# Patient Record
Sex: Female | Born: 1985 | Race: White | Hispanic: No | Marital: Married | Smoking: Never smoker
Health system: Southern US, Community
[De-identification: ages and names within clinical notes are randomized; demographics above are authoritative.]

## PROBLEM LIST (undated history)

## (undated) DIAGNOSIS — J45909 Unspecified asthma, uncomplicated: Secondary | ICD-10-CM

## (undated) DIAGNOSIS — Z789 Other specified health status: Secondary | ICD-10-CM

## (undated) DIAGNOSIS — F329 Major depressive disorder, single episode, unspecified: Secondary | ICD-10-CM

## (undated) DIAGNOSIS — F32A Depression, unspecified: Secondary | ICD-10-CM

## (undated) HISTORY — PX: CHOLECYSTECTOMY: SHX55

---
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
## 2012-04-01 LAB — OB RESULTS CONSOLE ABO/RH: RH Type: POSITIVE

## 2012-04-01 LAB — OB RESULTS CONSOLE ANTIBODY SCREEN: Antibody Screen: NEGATIVE

## 2012-04-01 LAB — OB RESULTS CONSOLE GC/CHLAMYDIA: Gonorrhea: NEGATIVE

## 2012-10-14 ENCOUNTER — Encounter (HOSPITAL_COMMUNITY): Payer: Self-pay | Admitting: *Deleted

## 2012-10-14 ENCOUNTER — Inpatient Hospital Stay (HOSPITAL_COMMUNITY)
Admission: AD | Admit: 2012-10-14 | Discharge: 2012-10-14 | Disposition: A | Discharge status: Home / Self Care | Payer: BC Managed Care – PPO | Source: Ambulatory Visit | Attending: Obstetrics and Gynecology | Admitting: Obstetrics and Gynecology

## 2012-10-14 DIAGNOSIS — O99891 Other specified diseases and conditions complicating pregnancy: Secondary | ICD-10-CM | POA: Insufficient documentation

## 2012-10-14 DIAGNOSIS — Z22330 Carrier of Group B streptococcus: Secondary | ICD-10-CM | POA: Insufficient documentation

## 2012-10-14 DIAGNOSIS — O479 False labor, unspecified: Secondary | ICD-10-CM | POA: Insufficient documentation

## 2012-10-14 HISTORY — DX: Other specified health status: Z78.9

## 2012-10-14 NOTE — MAU Note (Signed)
Pt presents for contractions that increased in intensity at 1400 today, and are now every 4-80minutes.  Denies any LOF or bleeding.  Does have a thick yellow-brown discharge.  Reports good fetal movement.  GBS positive.

## 2012-10-15 ENCOUNTER — Inpatient Hospital Stay (HOSPITAL_COMMUNITY): Payer: BC Managed Care – PPO | Admitting: Anesthesiology

## 2012-10-15 ENCOUNTER — Encounter (HOSPITAL_COMMUNITY): Payer: Self-pay | Admitting: *Deleted

## 2012-10-15 ENCOUNTER — Encounter (HOSPITAL_COMMUNITY): Payer: Self-pay

## 2012-10-15 ENCOUNTER — Encounter (HOSPITAL_COMMUNITY)
Admission: AD | Disposition: A | Discharge status: Home / Self Care | Payer: Self-pay | Source: Ambulatory Visit | Attending: Obstetrics and Gynecology

## 2012-10-15 ENCOUNTER — Inpatient Hospital Stay (HOSPITAL_COMMUNITY)
Admission: AD | Admit: 2012-10-15 | Discharge: 2012-10-18 | DRG: 371 | Disposition: A | Discharge status: Home / Self Care | Payer: BC Managed Care – PPO | Source: Ambulatory Visit | Attending: Obstetrics and Gynecology | Admitting: Obstetrics and Gynecology

## 2012-10-15 ENCOUNTER — Encounter (HOSPITAL_COMMUNITY): Payer: Self-pay | Admitting: Anesthesiology

## 2012-10-15 DIAGNOSIS — O99344 Other mental disorders complicating childbirth: Secondary | ICD-10-CM | POA: Diagnosis present

## 2012-10-15 DIAGNOSIS — F329 Major depressive disorder, single episode, unspecified: Secondary | ICD-10-CM | POA: Diagnosis present

## 2012-10-15 DIAGNOSIS — Z22330 Carrier of Group B streptococcus: Secondary | ICD-10-CM

## 2012-10-15 DIAGNOSIS — O324XX0 Maternal care for high head at term, not applicable or unspecified: Secondary | ICD-10-CM | POA: Diagnosis present

## 2012-10-15 DIAGNOSIS — Z98891 History of uterine scar from previous surgery: Secondary | ICD-10-CM

## 2012-10-15 DIAGNOSIS — F3289 Other specified depressive episodes: Secondary | ICD-10-CM | POA: Diagnosis present

## 2012-10-15 DIAGNOSIS — O99892 Other specified diseases and conditions complicating childbirth: Secondary | ICD-10-CM | POA: Diagnosis present

## 2012-10-15 LAB — CBC
HCT: 39.3 % (ref 36.0–46.0)
Hemoglobin: 13 g/dL (ref 12.0–15.0)
MCH: 30.4 pg (ref 26.0–34.0)
MCHC: 33.1 g/dL (ref 30.0–36.0)
RDW: 14.1 % (ref 11.5–15.5)

## 2012-10-15 LAB — TYPE AND SCREEN
ABO/RH(D): A POS
Antibody Screen: NEGATIVE

## 2012-10-15 LAB — ABO/RH: ABO/RH(D): A POS

## 2012-10-15 SURGERY — Surgical Case
Anesthesia: Epidural | Site: Abdomen | Wound class: Clean Contaminated

## 2012-10-15 MED ORDER — SCOPOLAMINE 1 MG/3DAYS TD PT72
MEDICATED_PATCH | TRANSDERMAL | Status: AC
  Administered 2012-10-15: 1.5 mg via TRANSDERMAL
  Filled 2012-10-15: qty 1

## 2012-10-15 MED ORDER — LACTATED RINGERS IV SOLN
INTRAVENOUS | Status: DC
  Administered 2012-10-15 (×3): via INTRAVENOUS

## 2012-10-15 MED ORDER — PHENYLEPHRINE 40 MCG/ML (10ML) SYRINGE FOR IV PUSH (FOR BLOOD PRESSURE SUPPORT)
PREFILLED_SYRINGE | INTRAVENOUS | Status: AC
  Filled 2012-10-15: qty 15

## 2012-10-15 MED ORDER — EPHEDRINE 5 MG/ML INJ
INTRAVENOUS | Status: AC
  Filled 2012-10-15: qty 10

## 2012-10-15 MED ORDER — FENTANYL 2.5 MCG/ML BUPIVACAINE 1/10 % EPIDURAL INFUSION (WH - ANES)
INTRAMUSCULAR | Status: DC | PRN
  Administered 2012-10-15: 14 mL/h via EPIDURAL

## 2012-10-15 MED ORDER — CEFAZOLIN SODIUM-DEXTROSE 2-3 GM-% IV SOLR: 2.0000 g | Freq: Once | INTRAVENOUS | Status: DC

## 2012-10-15 MED ORDER — CHLOROPROCAINE HCL 3 % IJ SOLN
INTRAMUSCULAR | Status: DC | PRN
  Administered 2012-10-15 (×2): 10 mL via EPIDURAL

## 2012-10-15 MED ORDER — LIDOCAINE HCL (PF) 1 % IJ SOLN
INTRAMUSCULAR | Status: DC | PRN
  Administered 2012-10-15 (×2): 4 mL

## 2012-10-15 MED ORDER — PHENYLEPHRINE HCL 10 MG/ML IJ SOLN
INTRAMUSCULAR | Status: DC | PRN
  Administered 2012-10-15 (×2): 80 ug via INTRAVENOUS
  Administered 2012-10-15: 40 ug via INTRAVENOUS

## 2012-10-15 MED ORDER — TERBUTALINE SULFATE 1 MG/ML IJ SOLN: 0.2500 mg | Freq: Once | INTRAMUSCULAR | Status: DC | PRN

## 2012-10-15 MED ORDER — PHENYLEPHRINE 40 MCG/ML (10ML) SYRINGE FOR IV PUSH (FOR BLOOD PRESSURE SUPPORT): 80.0000 ug | PREFILLED_SYRINGE | INTRAVENOUS | Status: DC | PRN

## 2012-10-15 MED ORDER — ONDANSETRON HCL 4 MG/2ML IJ SOLN: 4.0000 mg | Freq: Four times a day (QID) | INTRAMUSCULAR | Status: DC | PRN

## 2012-10-15 MED ORDER — LACTATED RINGERS IV SOLN
INTRAVENOUS | Status: DC | PRN
  Administered 2012-10-15 (×2): via INTRAVENOUS

## 2012-10-15 MED ORDER — LACTATED RINGERS IV SOLN: 500.0000 mL | Freq: Once | INTRAVENOUS | Status: DC

## 2012-10-15 MED ORDER — OXYTOCIN 10 UNIT/ML IJ SOLN
INTRAMUSCULAR | Status: AC
  Filled 2012-10-15: qty 4

## 2012-10-15 MED ORDER — SODIUM BICARBONATE 8.4 % IV SOLN
INTRAVENOUS | Status: AC
  Filled 2012-10-15: qty 50

## 2012-10-15 MED ORDER — LACTATED RINGERS IV SOLN: 500.0000 mL | INTRAVENOUS | Status: DC | PRN

## 2012-10-15 MED ORDER — LIDOCAINE HCL (PF) 1 % IJ SOLN
30.0000 mL | INTRAMUSCULAR | Status: DC | PRN
  Filled 2012-10-15: qty 30

## 2012-10-15 MED ORDER — ONDANSETRON HCL 4 MG/2ML IJ SOLN
INTRAMUSCULAR | Status: AC
  Filled 2012-10-15: qty 2

## 2012-10-15 MED ORDER — MEPERIDINE HCL 25 MG/ML IJ SOLN
INTRAMUSCULAR | Status: DC | PRN
  Administered 2012-10-15 (×2): 12.5 mg via INTRAVENOUS

## 2012-10-15 MED ORDER — FENTANYL 2.5 MCG/ML BUPIVACAINE 1/10 % EPIDURAL INFUSION (WH - ANES)
14.0000 mL/h | INTRAMUSCULAR | Status: DC
  Administered 2012-10-15: 14 mL/h via EPIDURAL
  Filled 2012-10-15 (×2): qty 125

## 2012-10-15 MED ORDER — EPHEDRINE 5 MG/ML INJ: 10.0000 mg | INTRAVENOUS | Status: DC | PRN

## 2012-10-15 MED ORDER — DIPHENHYDRAMINE HCL 50 MG/ML IJ SOLN: 12.5000 mg | INTRAMUSCULAR | Status: DC | PRN

## 2012-10-15 MED ORDER — OXYCODONE-ACETAMINOPHEN 5-325 MG PO TABS: 1.0000 | ORAL_TABLET | ORAL | Status: DC | PRN

## 2012-10-15 MED ORDER — ONDANSETRON HCL 4 MG/2ML IJ SOLN
INTRAMUSCULAR | Status: DC | PRN
  Administered 2012-10-15: 4 mg via INTRAVENOUS

## 2012-10-15 MED ORDER — LIDOCAINE-EPINEPHRINE (PF) 2 %-1:200000 IJ SOLN
INTRAMUSCULAR | Status: AC
  Filled 2012-10-15: qty 20

## 2012-10-15 MED ORDER — PHENYLEPHRINE 40 MCG/ML (10ML) SYRINGE FOR IV PUSH (FOR BLOOD PRESSURE SUPPORT)
PREFILLED_SYRINGE | INTRAVENOUS | Status: AC
  Filled 2012-10-15: qty 5

## 2012-10-15 MED ORDER — LACTATED RINGERS IV SOLN
INTRAVENOUS | Status: DC | PRN
  Administered 2012-10-15: 22:00:00 via INTRAVENOUS

## 2012-10-15 MED ORDER — EPHEDRINE 5 MG/ML INJ
10.0000 mg | INTRAVENOUS | Status: DC | PRN
  Filled 2012-10-15: qty 4

## 2012-10-15 MED ORDER — PENICILLIN G POTASSIUM 5000000 UNITS IJ SOLR
5.0000 10*6.[IU] | Freq: Once | INTRAMUSCULAR | Status: AC
  Administered 2012-10-15: 5 10*6.[IU] via INTRAVENOUS
  Filled 2012-10-15: qty 5

## 2012-10-15 MED ORDER — OXYTOCIN 10 UNIT/ML IJ SOLN
40.0000 [IU] | INTRAVENOUS | Status: DC | PRN
  Administered 2012-10-15: 40 [IU] via INTRAVENOUS

## 2012-10-15 MED ORDER — ACETAMINOPHEN 325 MG PO TABS: 650.0000 mg | ORAL_TABLET | ORAL | Status: DC | PRN

## 2012-10-15 MED ORDER — CEFAZOLIN SODIUM-DEXTROSE 2-3 GM-% IV SOLR
INTRAVENOUS | Status: DC | PRN
  Administered 2012-10-15: 2 g via INTRAVENOUS

## 2012-10-15 MED ORDER — KETOROLAC TROMETHAMINE 60 MG/2ML IM SOLN
60.0000 mg | Freq: Once | INTRAMUSCULAR | Status: AC | PRN
  Administered 2012-10-15: 60 mg via INTRAMUSCULAR

## 2012-10-15 MED ORDER — MEPERIDINE HCL 25 MG/ML IJ SOLN: 6.2500 mg | INTRAMUSCULAR | Status: DC | PRN

## 2012-10-15 MED ORDER — MORPHINE SULFATE 0.5 MG/ML IJ SOLN
INTRAMUSCULAR | Status: AC
  Filled 2012-10-15: qty 10

## 2012-10-15 MED ORDER — CHLOROPROCAINE HCL 3 % IJ SOLN
INTRAMUSCULAR | Status: AC
  Filled 2012-10-15: qty 20

## 2012-10-15 MED ORDER — OXYTOCIN BOLUS FROM INFUSION: 500.0000 mL | INTRAVENOUS | Status: DC

## 2012-10-15 MED ORDER — PENICILLIN G POTASSIUM 5000000 UNITS IJ SOLR
2.5000 10*6.[IU] | INTRAVENOUS | Status: DC
  Administered 2012-10-15 (×2): 2.5 10*6.[IU] via INTRAVENOUS
  Filled 2012-10-15 (×5): qty 2.5

## 2012-10-15 MED ORDER — OXYTOCIN 40 UNITS IN LACTATED RINGERS INFUSION - SIMPLE MED
1.0000 m[IU]/min | INTRAVENOUS | Status: DC
  Administered 2012-10-15: 1 m[IU]/min via INTRAVENOUS
  Filled 2012-10-15: qty 1000

## 2012-10-15 MED ORDER — FENTANYL CITRATE 0.05 MG/ML IJ SOLN: 25.0000 ug | INTRAMUSCULAR | Status: DC | PRN

## 2012-10-15 MED ORDER — IBUPROFEN 600 MG PO TABS: 600.0000 mg | ORAL_TABLET | Freq: Four times a day (QID) | ORAL | Status: DC | PRN

## 2012-10-15 MED ORDER — CEFAZOLIN SODIUM-DEXTROSE 2-3 GM-% IV SOLR
INTRAVENOUS | Status: AC
  Filled 2012-10-15: qty 50

## 2012-10-15 MED ORDER — SCOPOLAMINE 1 MG/3DAYS TD PT72
1.0000 | MEDICATED_PATCH | Freq: Once | TRANSDERMAL | Status: DC
  Administered 2012-10-15: 1.5 mg via TRANSDERMAL

## 2012-10-15 MED ORDER — CITRIC ACID-SODIUM CITRATE 334-500 MG/5ML PO SOLN
30.0000 mL | ORAL | Status: DC | PRN
  Administered 2012-10-15: 30 mL via ORAL
  Filled 2012-10-15: qty 15

## 2012-10-15 MED ORDER — OXYTOCIN 40 UNITS IN LACTATED RINGERS INFUSION - SIMPLE MED: 62.5000 mL/h | INTRAVENOUS | Status: DC

## 2012-10-15 MED ORDER — SODIUM BICARBONATE 8.4 % IV SOLN
INTRAVENOUS | Status: DC | PRN
  Administered 2012-10-15: 5 mL via EPIDURAL

## 2012-10-15 MED ORDER — MORPHINE SULFATE (PF) 0.5 MG/ML IJ SOLN
INTRAMUSCULAR | Status: DC | PRN
  Administered 2012-10-15: 1 mg via INTRAVENOUS
  Administered 2012-10-15: 4 mg via EPIDURAL

## 2012-10-15 MED ORDER — MEPERIDINE HCL 25 MG/ML IJ SOLN
INTRAMUSCULAR | Status: AC
  Filled 2012-10-15: qty 1

## 2012-10-15 MED ORDER — PHENYLEPHRINE 40 MCG/ML (10ML) SYRINGE FOR IV PUSH (FOR BLOOD PRESSURE SUPPORT)
80.0000 ug | PREFILLED_SYRINGE | INTRAVENOUS | Status: DC | PRN
  Filled 2012-10-15: qty 5

## 2012-10-15 MED ORDER — KETOROLAC TROMETHAMINE 60 MG/2ML IM SOLN
INTRAMUSCULAR | Status: AC
  Administered 2012-10-15: 60 mg via INTRAMUSCULAR
  Filled 2012-10-15: qty 2

## 2012-10-15 MED ORDER — OXYTOCIN 40 UNITS IN LACTATED RINGERS INFUSION - SIMPLE MED: 1.0000 m[IU]/min | INTRAVENOUS | Status: DC

## 2012-10-15 SURGICAL SUPPLY — 35 items
BENZOIN TINCTURE PRP APPL 2/3 (GAUZE/BANDAGES/DRESSINGS) ×2 IMPLANT
CLOTH BEACON ORANGE TIMEOUT ST (SAFETY) ×2 IMPLANT
CONTAINER PREFILL 10% NBF 15ML (MISCELLANEOUS) IMPLANT
DRAPE LG THREE QUARTER DISP (DRAPES) ×2 IMPLANT
DRSG OPSITE POSTOP 4X10 (GAUZE/BANDAGES/DRESSINGS) ×2 IMPLANT
DURAPREP 26ML APPLICATOR (WOUND CARE) ×2 IMPLANT
ELECT REM PT RETURN 9FT ADLT (ELECTROSURGICAL) ×2
ELECTRODE REM PT RTRN 9FT ADLT (ELECTROSURGICAL) ×1 IMPLANT
EXTRACTOR VACUUM KIWI (MISCELLANEOUS) IMPLANT
EXTRACTOR VACUUM M CUP 4 TUBE (SUCTIONS) IMPLANT
GLOVE BIO SURGEON STRL SZ 6.5 (GLOVE) ×2 IMPLANT
GOWN PREVENTION PLUS LG XLONG (DISPOSABLE) ×4 IMPLANT
KIT ABG SYR 3ML LUER SLIP (SYRINGE) IMPLANT
NEEDLE HYPO 25X5/8 SAFETYGLIDE (NEEDLE) IMPLANT
NS IRRIG 1000ML POUR BTL (IV SOLUTION) ×2 IMPLANT
PACK C SECTION WH (CUSTOM PROCEDURE TRAY) ×2 IMPLANT
PAD ABD 7.5X8 STRL (GAUZE/BANDAGES/DRESSINGS) ×2 IMPLANT
PAD OB MATERNITY 4.3X12.25 (PERSONAL CARE ITEMS) ×2 IMPLANT
RTRCTR C-SECT PINK 25CM LRG (MISCELLANEOUS) ×2 IMPLANT
SLEEVE SCD COMPRESS KNEE MED (MISCELLANEOUS) ×2 IMPLANT
STAPLER VISISTAT 35W (STAPLE) IMPLANT
STRIP CLOSURE SKIN 1/2X4 (GAUZE/BANDAGES/DRESSINGS) IMPLANT
SUT CHROMIC 1 CTX 36 (SUTURE) ×4 IMPLANT
SUT PLAIN 0 NONE (SUTURE) IMPLANT
SUT PLAIN 2 0 XLH (SUTURE) IMPLANT
SUT VIC AB 0 CT1 27 (SUTURE) ×2
SUT VIC AB 0 CT1 27XBRD ANBCTR (SUTURE) ×2 IMPLANT
SUT VIC AB 2-0 CT1 27 (SUTURE)
SUT VIC AB 2-0 CT1 TAPERPNT 27 (SUTURE) IMPLANT
SUT VIC AB 3-0 SH 27 (SUTURE) ×1
SUT VIC AB 3-0 SH 27X BRD (SUTURE) ×1 IMPLANT
SUT VIC AB 4-0 KS 27 (SUTURE) IMPLANT
TOWEL OR 17X24 6PK STRL BLUE (TOWEL DISPOSABLE) ×6 IMPLANT
TRAY FOLEY CATH 14FR (SET/KITS/TRAYS/PACK) ×2 IMPLANT
WATER STERILE IRR 1000ML POUR (IV SOLUTION) ×2 IMPLANT

## 2012-10-15 NOTE — MAU Note (Signed)
C/o ucs all day yesterday and last night;

## 2012-10-15 NOTE — Progress Notes (Signed)
Patient ID: Krystal Stephenson, female   DOB: [birth year], 26 y.o.   MRN: [id number] Pt feeling pressure and uncomfortable.  FHR reactive.   Cervix c/9+/0 with contraction  Tried one push with no real descent noted.  Will try to re-dose epidural a little and push when vertex lower.

## 2012-10-15 NOTE — H&P (Signed)
Krystal Stephenson is a 26 y.o. female G1P0 at 39+ weeks (EDD 10/21/12 by 10 week [place]) presenting for painful contractions and cervical change to 4cm.  Pt was in MAU last pm and 3cm, now 4cm and very uncomfortable.  Prenatal care complicated by depression, sstable on prozac.  Also, GBS positive.  No other significant issues.  Fhx of Chiari malformation.  Maternal Medical History:  Reason for admission: Reason for admission: contractions.  Contractions: Onset was 6-12 hours ago.   Perceived severity is strong.    Fetal activity: Perceived fetal activity is normal.      OB History    Grav Para Term Preterm Abortions TAB SAB Ect Mult Living   1              Past Medical History  Diagnosis Date  . No pertinent past medical history    History reviewed. No pertinent past surgical history. Family History: family history includes Cancer in her paternal aunt and paternal grandfather and Diabetes in her maternal uncle. Social History:  reports that she has never smoked. She does not have any smokeless tobacco history on file. She reports that she does not drink alcohol or use illicit drugs.   Prenatal Transfer Tool  Maternal Diabetes: No Genetic Screening: Declined Maternal Ultrasounds/Referrals: Normal Fetal Ultrasounds or other Referrals:  None Maternal Substance Abuse:  No Significant Maternal Medications:  Meds include: Prozac Significant Maternal Lab Results:  Lab values include: Group B Strep positive Other Comments:  None  ROS  Dilation: 4 Effacement (%): 80 Exam by:: Morrison Old RN Blood pressure 123/79, pulse 102, temperature 98.3 F (36.8 C), temperature source Oral, resp. rate 22, height 5\' 4"  (1.626 m), weight 92.987 kg (205 lb). Maternal Exam:  Uterine Assessment: Contraction strength is moderate.  Contraction frequency is regular.   Abdomen: Patient reports no abdominal tenderness. Fetal presentation: vertex  Introitus: Normal vulva. Normal vagina.    Physical Exam   Constitutional: She is oriented to person, place, and time. She appears well-developed and well-nourished.  Cardiovascular: Normal rate and regular rhythm.   Respiratory: Effort normal and breath sounds normal.  GI: Soft. Bowel sounds are normal.  Genitourinary: Vagina normal and uterus normal.  Neurological: She is alert and oriented to person, place, and time.  Psychiatric: She has a normal mood and affect. Her behavior is normal.    Prenatal labs: ABO, Rh: A/Positive/-- (06/03 0000) Antibody: Negative (06/03 0000) Rubella: Immune (06/03 0000) RPR: Nonreactive (06/03 0000)  HBsAg: Negative (06/03 0000)  HIV: Non-reactive (06/03 0000)  GBS: Positive (06/03 0000)  One hour GTT 86 Declined genetics   Assessment/Plan: Pt to be admitted and placed on GBS prophylaxis, requesting epidural.     Britta Louth W 10/15/2012, 9:06 AM

## 2012-10-15 NOTE — Anesthesia Preprocedure Evaluation (Signed)
Anesthesia Evaluation  Patient identified by MRN, date of birth, ID band Patient awake    Reviewed: Allergy & Precautions, H&P , Patient's Chart, lab work & pertinent test results  Airway Mallampati: III TM Distance: >3 FB Neck ROM: full    Dental No notable dental hx. (+) Teeth Intact   Pulmonary neg pulmonary ROS,  breath sounds clear to auscultation  Pulmonary exam normal       Cardiovascular negative cardio ROS  Rhythm:regular Rate:Normal     Neuro/Psych negative neurological ROS  negative psych ROS   GI/Hepatic negative GI ROS, Neg liver ROS,   Endo/Other  negative endocrine ROSMorbid obesity  Renal/GU negative Renal ROS  negative genitourinary   Musculoskeletal   Abdominal Normal abdominal exam  (+)   Peds  Hematology negative hematology ROS (+)   Anesthesia Other Findings   Reproductive/Obstetrics (+) Pregnancy                           Anesthesia Physical Anesthesia Plan  ASA: II  Anesthesia Plan: Epidural   Post-op Pain Management:    Induction:   Airway Management Planned:   Additional Equipment:   Intra-op Plan:   Post-operative Plan:   Informed Consent: I have reviewed the patients History and Physical, chart, labs and discussed the procedure including the risks, benefits and alternatives for the proposed anesthesia with the patient or authorized representative who has indicated his/her understanding and acceptance.     Plan Discussed with: Anesthesiologist  Anesthesia Plan Comments:         Anesthesia Quick Evaluation

## 2012-10-15 NOTE — Anesthesia Postprocedure Evaluation (Signed)
Anesthesia Post Note  Patient: Krystal Stephenson  Procedure(s) Performed: Procedure(s) (LRB): CESAREAN SECTION (N/A)  Anesthesia type: Epidural  Patient location: PACU  Post pain: Pain level controlled  Post assessment: Post-op Vital signs reviewed  Last Vitals:  Filed Vitals:   10/15/12 2245  BP:   Pulse: 95  Temp:   Resp: 16    Post vital signs: stable  Level of consciousness: awake  Complications: No apparent anesthesia complications

## 2012-10-15 NOTE — Op Note (Signed)
Operative note  Preoperative diagnosis Term pregnancy at 39+ weeks Arrest of descent Failed vacuum  Postoperative diagnosis Same  Procedure Primary low transverse C-section with 2 layer closure of uterus  Surgeon Dr. Huel Cote  Anesthesia Epidural  Fluids Estimated blood loss 800 cc Urine output 100 cc clear urine IV fluids 2200 cc LR  Findings There is a viable female infant in the vertex presentation. Apgars were 8 and 9. Weight was pending at time of dictation. Patient uterus and ovaries and tubes were normal in appearance. There was mild uterine atony which responded to bimanual massage and IV Pitocin.  Specimen Placenta sent to L&D  Procedure note After informed consent was obtained from the patient she was taken to the operating room where epidural anesthesia was found to be adequate by Allis clamp test. She was then prepped and draped in normal sterile fashion in the dorsal supine position with a leftward tilt. An appropriate time out was performed. A Pfannenstiel was incision was then made with the scalpel and carried through to the underlying layer of fascia by sharp dissection and Bovie cautery. The fascia was nicked in the midline and the incision was extended laterally. The inferior aspect of the incision was grasped with Coker clamps elevated and dissected off the underlying rectus muscles. In a similar fashion the superior aspect was dissected off the rectus muscles. The rectus muscles were then separated in the midline and the peritoneal cavity entered bluntly. Peritoneal incision was then extended both superiorly and inferiorly with careful attention to avoid both bowel bladder. He Alexis self-retaining wound retractor was then placed within the incision and the lower uterine segment exposed nicely. This was incised in a transverse fashion after the longer flap was created. The infant's head was deep in the pelvis and molded however was elevated to the level of  the incision without difficulty and delivered atraumatically. The nose mouth bulb suctioned and the remainder of the infant was delivered and noted to be quite large. The cord was clamped and cut and infant handed to the waiting pediatricians. The cord blood was obtained and the placenta then delivered spontaneously. The uterus was massaged and cleared of all clots and debris with moist lap sponge. Mild atony responded to the massage and IV Pitocin. The incision was then closed in 2 layers the first a running locked layer 1-0 chromic and the second an imbricating layer of the same. Sutures were also placed at the left angle and along the midline to obtain good hemostasis. Tubes and ovaries were inspected and found to be normal gutters were cleared of all clots and debris. The incision then appeared hemostatic and the Alexis retractor and all insurance and sponges were removed from the abdomen. The rectus muscles and peritoneum were then closed with 2-0 Vicryl in several interrupted mattress sutures. The fascia was closed with 0 Vicryl in a running fashion. The subcutaneous tissue was closed with 3-0 plain in a running fashion. The skin was closed with 4-0 Vicryl in a subcuticular stitch on a Keith needle. It was then reinforced with benzoin and Steri-Strips. The patient was then taken to the recovery room in good condition with the baby accompanying her. All instruments and sponge counts were once again correct.

## 2012-10-15 NOTE — Transfer of Care (Signed)
Immediate Anesthesia Transfer of Care Note  Patient: Krystal Stephenson  Procedure(s) Performed: Procedure(s) (LRB) with comments: CESAREAN SECTION (N/A)  Patient Location: PACU  Anesthesia Type:Epidural  Level of Consciousness: awake, alert  and oriented  Airway & Oxygen Therapy: Patient Spontanous Breathing  Post-op Assessment: Report given to PACU RN and Post -op Vital signs reviewed and stable  Post vital signs: Reviewed and stable  Complications: No apparent anesthesia complications

## 2012-10-15 NOTE — Brief Op Note (Signed)
10/15/2012  10:31 PM  PATIENT:  Krystal Stephenson  26 y.o. female  PRE-OPERATIVE DIAGNOSIS:  arrest of descent, failed vaccum  POST-OPERATIVE DIAGNOSIS:  arrest of descent, failed vaccum  PROCEDURE:  Procedure(s) (LRB) with comments: CESAREAN SECTION (N/A)  SURGEON:  Surgeon(s) and Role:    * [name], MD - Primary  ANESTHESIA:   epidural  EBL:  Total I/O In: 2500 [I.V.:2500] Out: 2050 [Urine:1250; Blood:800]  BLOOD ADMINISTERED:none  DRAINS: Urinary Catheter (Foley)   LOCAL MEDICATIONS USED:  BUPIVICAINE poured into the incision  SPECIMEN:  placenta  DISPOSITION OF SPECIMEN:  L&D  COUNTS:  YES  TOURNIQUET:  * No tourniquets in log *  DICTATION: .Dragon Dictation  PLAN OF CARE: Admit to inpatient   PATIENT DISPOSITION:  PACU - hemodynamically stable.

## 2012-10-15 NOTE — Progress Notes (Signed)
Patient ID: Krystal Stephenson, female   DOB: [birth year], 27 y.o.   MRN: [id number] Pt comfortable with epidural. FHR reactive Cervix 80/5/-1 AROM moderate meconium IUPC placed to adjust pitocin and trace contractions Follow progress.

## 2012-10-15 NOTE — Progress Notes (Signed)
Patient ID: Krystal Stephenson, female   DOB: 1986/03/22, 26 y.o.   MRN: [id number] Pt reached complete dilation and pushed a little over 2 hours bringing vertex to +2 station.  She became very uncomfortable and required boosting of her epidural x 2 while pushing. At the 2 hour mark she became exhausted and we discussed a trial of vacuum for delivery. Risks and benefits were reviewed and pt agreed to proceed.  The vertex was LOA +2 station and had a fair amount of caput.  THe soft cup vacuum was attempted x 1 and the pt was too uncomfortable to proceed, so we took a break and boosted the epidural again.  Once the patient was comfortable, two attempts were made with the soft cup and one with the kiwi vacuum with no advancement of station and 2 pop-offs.  The FHR remained reassuring but at this point I told the patient we needed to proceed with c-section.  Risks and benefits were discussed and the patient agreed to proceed.

## 2012-10-15 NOTE — Anesthesia Procedure Notes (Signed)
Epidural Patient location during procedure: OB Start time: 10/15/2012 10:16 AM  Staffing Anesthesiologist: Kadasia Kassing A. Performed by: anesthesiologist   Preanesthetic Checklist Completed: patient identified, site marked, surgical consent, pre-op evaluation, timeout performed, IV checked, risks and benefits discussed and monitors and equipment checked  Epidural Patient position: sitting Prep: site prepped and draped and DuraPrep Patient monitoring: continuous pulse ox and blood pressure Approach: midline Injection technique: LOR air  Needle:  Needle type: Tuohy  Needle gauge: 17 G Needle length: 9 cm and 9 Needle insertion depth: 5 cm cm Catheter type: closed end flexible Catheter size: 19 Gauge Catheter at skin depth: 10 cm Test dose: negative and Other  Assessment Events: blood not aspirated, injection not painful, no injection resistance, negative IV test and no paresthesia  Additional Notes Patient identified. Risks and benefits discussed including failed block, incomplete  Pain control, post dural puncture headache, nerve damage, paralysis, blood pressure Changes, nausea, vomiting, reactions to medications-both toxic and allergic and post Partum back pain. All questions were answered. Patient expressed understanding and wished to proceed. Sterile technique was used throughout procedure. Epidural site was Dressed with sterile barrier dressing. No paresthesias, signs of intravascular injection Or signs of intrathecal spread were encountered.  Patient was more comfortable after the epidural was dosed. Please see RN's note for documentation of vital signs and FHR which are stable.

## 2012-10-16 LAB — CBC
Hemoglobin: 11.1 g/dL — ABNORMAL LOW (ref 12.0–15.0)
MCH: 30.7 pg (ref 26.0–34.0)
MCV: 91.7 fL (ref 78.0–100.0)
RBC: 3.62 MIL/uL — ABNORMAL LOW (ref 3.87–5.11)

## 2012-10-16 MED ORDER — DIPHENHYDRAMINE HCL 25 MG PO CAPS: 25.0000 mg | ORAL_CAPSULE | ORAL | Status: DC | PRN

## 2012-10-16 MED ORDER — IBUPROFEN 600 MG PO TABS
600.0000 mg | ORAL_TABLET | Freq: Four times a day (QID) | ORAL | Status: DC | PRN
  Filled 2012-10-16 (×5): qty 1

## 2012-10-16 MED ORDER — IBUPROFEN 600 MG PO TABS
600.0000 mg | ORAL_TABLET | Freq: Four times a day (QID) | ORAL | Status: DC
  Administered 2012-10-16 – 2012-10-18 (×10): 600 mg via ORAL
  Filled 2012-10-16 (×5): qty 1

## 2012-10-16 MED ORDER — ONDANSETRON HCL 4 MG/2ML IJ SOLN: 4.0000 mg | Freq: Three times a day (TID) | INTRAMUSCULAR | Status: DC | PRN

## 2012-10-16 MED ORDER — METOCLOPRAMIDE HCL 5 MG/ML IJ SOLN: 10.0000 mg | Freq: Three times a day (TID) | INTRAMUSCULAR | Status: DC | PRN

## 2012-10-16 MED ORDER — SIMETHICONE 80 MG PO CHEW: 80.0000 mg | CHEWABLE_TABLET | ORAL | Status: DC | PRN

## 2012-10-16 MED ORDER — WITCH HAZEL-GLYCERIN EX PADS: 1.0000 "application " | MEDICATED_PAD | CUTANEOUS | Status: DC | PRN

## 2012-10-16 MED ORDER — DIBUCAINE 1 % RE OINT: 1.0000 "application " | TOPICAL_OINTMENT | RECTAL | Status: DC | PRN

## 2012-10-16 MED ORDER — OXYTOCIN 40 UNITS IN LACTATED RINGERS INFUSION - SIMPLE MED: 62.5000 mL/h | INTRAVENOUS | Status: AC

## 2012-10-16 MED ORDER — TETANUS-DIPHTH-ACELL PERTUSSIS 5-2.5-18.5 LF-MCG/0.5 IM SUSP: 0.5000 mL | Freq: Once | INTRAMUSCULAR | Status: DC

## 2012-10-16 MED ORDER — SODIUM CHLORIDE 0.9 % IJ SOLN: 3.0000 mL | INTRAMUSCULAR | Status: DC | PRN

## 2012-10-16 MED ORDER — PRENATAL MULTIVITAMIN CH
1.0000 | ORAL_TABLET | Freq: Every day | ORAL | Status: DC
  Administered 2012-10-16 – 2012-10-18 (×3): 1 via ORAL
  Filled 2012-10-16 (×2): qty 1

## 2012-10-16 MED ORDER — MENTHOL 3 MG MT LOZG: 1.0000 | LOZENGE | OROMUCOSAL | Status: DC | PRN

## 2012-10-16 MED ORDER — OXYCODONE-ACETAMINOPHEN 5-325 MG PO TABS
1.0000 | ORAL_TABLET | ORAL | Status: DC | PRN
  Administered 2012-10-17: 1 via ORAL
  Administered 2012-10-17 (×2): 0.5 via ORAL
  Administered 2012-10-18: 1 via ORAL
  Filled 2012-10-16 (×4): qty 1

## 2012-10-16 MED ORDER — SENNOSIDES-DOCUSATE SODIUM 8.6-50 MG PO TABS
2.0000 | ORAL_TABLET | Freq: Every day | ORAL | Status: DC
  Administered 2012-10-16 – 2012-10-17 (×2): 2 via ORAL

## 2012-10-16 MED ORDER — DIPHENHYDRAMINE HCL 50 MG/ML IJ SOLN: 25.0000 mg | INTRAMUSCULAR | Status: DC | PRN

## 2012-10-16 MED ORDER — KETOROLAC TROMETHAMINE 30 MG/ML IJ SOLN: 30.0000 mg | Freq: Four times a day (QID) | INTRAMUSCULAR | Status: AC | PRN

## 2012-10-16 MED ORDER — LACTATED RINGERS IV SOLN
INTRAVENOUS | Status: DC
  Administered 2012-10-16: 03:00:00 via INTRAVENOUS

## 2012-10-16 MED ORDER — ONDANSETRON HCL 4 MG/2ML IJ SOLN: 4.0000 mg | INTRAMUSCULAR | Status: DC | PRN

## 2012-10-16 MED ORDER — NALBUPHINE HCL 10 MG/ML IJ SOLN
5.0000 mg | INTRAMUSCULAR | Status: DC | PRN
  Filled 2012-10-16: qty 1

## 2012-10-16 MED ORDER — ONDANSETRON HCL 4 MG PO TABS
4.0000 mg | ORAL_TABLET | ORAL | Status: DC | PRN
Start: 2012-10-16 — End: 2012-10-18

## 2012-10-16 MED ORDER — ZOLPIDEM TARTRATE 5 MG PO TABS: 5.0000 mg | ORAL_TABLET | Freq: Every evening | ORAL | Status: DC | PRN

## 2012-10-16 MED ORDER — SIMETHICONE 80 MG PO CHEW
80.0000 mg | CHEWABLE_TABLET | Freq: Three times a day (TID) | ORAL | Status: DC
  Administered 2012-10-16 – 2012-10-17 (×7): 80 mg via ORAL

## 2012-10-16 MED ORDER — DIPHENHYDRAMINE HCL 25 MG PO CAPS: 25.0000 mg | ORAL_CAPSULE | Freq: Four times a day (QID) | ORAL | Status: DC | PRN

## 2012-10-16 MED ORDER — DEXTROSE 5 % IV SOLN
1.0000 ug/kg/h | INTRAVENOUS | Status: DC | PRN
  Filled 2012-10-16: qty 2

## 2012-10-16 MED ORDER — NALOXONE HCL 0.4 MG/ML IJ SOLN: 0.4000 mg | INTRAMUSCULAR | Status: DC | PRN

## 2012-10-16 MED ORDER — LANOLIN HYDROUS EX OINT: 1.0000 "application " | TOPICAL_OINTMENT | CUTANEOUS | Status: DC | PRN

## 2012-10-16 MED ORDER — DIPHENHYDRAMINE HCL 50 MG/ML IJ SOLN
12.5000 mg | INTRAMUSCULAR | Status: DC | PRN
  Administered 2012-10-16: 12.5 mg via INTRAVENOUS
  Filled 2012-10-16: qty 1

## 2012-10-16 NOTE — Progress Notes (Addendum)
Subjective: Postpartum Day 1 Cesarean Delivery Patient reports tolerating PO.  Pain controlled  Objective: Vital signs in last 24 hours: Temp:  [97.9 F (36.6 C)-99.7 F (37.6 C)] 98.2 F (36.8 C) (12/18 0630) Pulse Rate:  [77-200] 101  (12/18 0640) Resp:  [16-22] 20  (12/18 0630) BP: (77-128)/(41-86) 95/47 mmHg (12/18 0640) SpO2:  [89 %-100 %] 97 % (12/18 0630)  Physical Exam:  General: alert and cooperative Lochia: appropriate Uterine Fundus: firm Incision: C/D/I    Basename 10/16/12 0455 10/15/12 0919  HGB 11.1* 13.0  HCT 33.2* 39.3    Assessment/Plan: Status post Cesarean section. Doing well postoperatively.  Continue current care.  [name] 10/16/2012, 8:59 AM

## 2012-10-16 NOTE — Addendum Note (Signed)
Addendum  created 10/16/12 1327 by Suella Grove, CRNA   Modules edited:Notes Section

## 2012-10-16 NOTE — Addendum Note (Signed)
Addendum  created 10/16/12 1323 by Suella Grove, CRNA   Modules edited:Notes Section

## 2012-10-16 NOTE — Anesthesia Postprocedure Evaluation (Signed)
  Anesthesia Post-op Note  Patient: Krystal Stephenson  Procedure(s) Performed: * No procedures listed *  Patient Location: Mother/Baby  Anesthesia Type:Epidural  Level of Consciousness: awake  Airway and Oxygen Therapy: Patient Spontanous Breathing  Post-op Pain: none  Post-op Assessment: Patient's Cardiovascular Status Stable, Respiratory Function Stable, Patent Airway, No signs of Nausea or vomiting, Adequate PO intake, Pain level controlled, No headache, No backache, No residual numbness and No residual motor weakness  Post-op Vital Signs: Reviewed and stable  Complications: No apparent anesthesia complications

## 2012-10-17 NOTE — Clinical Social Work Note (Signed)
CSW reviewed chart due to consult for h/o depression. Pt is currently managed on Prozac.  CSW signing off.   Doreen Salvage, LCSW  Coverning NICU for [facility] M-F 8am-12pm

## 2012-10-17 NOTE — Progress Notes (Signed)
Subjective: Postpartum Day 2 Cesarean Delivery Patient reports incisional pain, tolerating PO and no problems voiding.    Objective: Vital signs in last 24 hours: Temp:  [97.5 F (36.4 C)-98.6 F (37 C)] 97.9 F (36.6 C) (12/19 0542) Pulse Rate:  [82-93] 88  (12/19 0542) Resp:  [16-20] 18  (12/19 0542) BP: (93-112)/(57-71) 112/71 mmHg (12/19 0542) SpO2:  [98 %] 98 % (12/18 1030)  Physical Exam:  General: alert and cooperative Lochia: appropriate Uterine Fundus: firm Incision: C/D/I    Basename 10/16/12 0455 10/15/12 0919  HGB 11.1* 13.0  HCT 33.2* 39.3    Assessment/Plan: Status post Cesarean section. Doing well postoperatively.  Continue current care.  Krystal Stephenson 10/17/2012, 8:53 AM

## 2012-10-18 MED ORDER — OXYCODONE-ACETAMINOPHEN 5-325 MG PO TABS: 1.0000 | ORAL_TABLET | ORAL | Status: DC | PRN

## 2012-10-18 MED ORDER — IBUPROFEN 600 MG PO TABS: 600.0000 mg | ORAL_TABLET | Freq: Four times a day (QID) | ORAL | Status: DC | PRN

## 2012-10-18 NOTE — Discharge Summary (Signed)
Obstetric Discharge Summary Reason for Admission: onset of labor Prenatal Procedures: none Intrapartum Procedures: cesarean: low cervical, transverse Postpartum Procedures: none Complications-Operative and Postpartum: none Hemoglobin  Date Value Range Status  10/16/2012 11.1* 12.0 - 15.0 g/dL Final     HCT  Date Value Range Status  10/16/2012 33.2* 36.0 - 46.0 % Final    Physical Exam:  General: alert and cooperative Lochia: appropriate Uterine Fundus: firm Incision: healing well, no significant drainage   Discharge Diagnoses: Term Pregnancy-delivered                                         Arrest of descent  Discharge Information: Date: 10/18/2012 Activity: pelvic rest Diet: routine Medications: Ibuprofen and Percocet Condition: improved Instructions: refer to practice specific booklet Discharge to: home Follow-up Information    Follow up with [name], MD. In 2 weeks. (incision check)    Contact information:   [address], [address] [place] [address] [phone]          Newborn Data: Live born female  Birth Weight: 9 lb 12.1 oz (4425 g) APGAR: 9, 9  Home with mother.  [name] 10/18/2012, 8:50 AM

## 2012-10-18 NOTE — Progress Notes (Signed)
Subjective: Postpartum Day 3 Cesarean Delivery Patient reports tolerating PO, + flatus and no problems voiding.    Objective: Vital signs in last 24 hours: Temp:  [97.7 F (36.5 C)-98 F (36.7 C)] 97.7 F (36.5 C) (12/20 0610) Pulse Rate:  [76-81] 76  (12/20 0610) Resp:  [18-20] 18  (12/20 0610) BP: (97-126)/(68-71) 97/68 mmHg (12/20 0610)  Physical Exam:  General: alert and cooperative Lochia: appropriate Uterine Fundus: firm Incision: healing well, no erythema    Basename 10/16/12 0455 10/15/12 0919  HGB 11.1* 13.0  HCT 33.2* 39.3    Assessment/Plan: Status post Cesarean section. Doing well postoperatively.  Discharge home with standard precautions and return to clinic in 2 weeks for incision check.  [name] 10/18/2012, 8:17 AM

## 2012-11-30 ENCOUNTER — Emergency Department (HOSPITAL_BASED_OUTPATIENT_CLINIC_OR_DEPARTMENT_OTHER)
Admission: EM | Admit: 2012-11-30 | Discharge: 2012-11-30 | Disposition: A | Discharge status: Home / Self Care | Payer: BC Managed Care – PPO | Attending: Emergency Medicine | Admitting: Emergency Medicine

## 2012-11-30 ENCOUNTER — Encounter (HOSPITAL_BASED_OUTPATIENT_CLINIC_OR_DEPARTMENT_OTHER): Payer: Self-pay | Admitting: Emergency Medicine

## 2012-11-30 DIAGNOSIS — Z8659 Personal history of other mental and behavioral disorders: Secondary | ICD-10-CM | POA: Insufficient documentation

## 2012-11-30 DIAGNOSIS — R112 Nausea with vomiting, unspecified: Secondary | ICD-10-CM | POA: Insufficient documentation

## 2012-11-30 DIAGNOSIS — Z79899 Other long term (current) drug therapy: Secondary | ICD-10-CM | POA: Insufficient documentation

## 2012-11-30 DIAGNOSIS — R197 Diarrhea, unspecified: Secondary | ICD-10-CM

## 2012-11-30 DIAGNOSIS — Z791 Long term (current) use of non-steroidal anti-inflammatories (NSAID): Secondary | ICD-10-CM | POA: Insufficient documentation

## 2012-11-30 DIAGNOSIS — R6883 Chills (without fever): Secondary | ICD-10-CM | POA: Insufficient documentation

## 2012-11-30 DIAGNOSIS — R110 Nausea: Secondary | ICD-10-CM

## 2012-11-30 DIAGNOSIS — R1110 Vomiting, unspecified: Secondary | ICD-10-CM

## 2012-11-30 DIAGNOSIS — Z3202 Encounter for pregnancy test, result negative: Secondary | ICD-10-CM | POA: Insufficient documentation

## 2012-11-30 HISTORY — DX: Depression, unspecified: F32.A

## 2012-11-30 HISTORY — DX: Major depressive disorder, single episode, unspecified: F32.9

## 2012-11-30 LAB — BASIC METABOLIC PANEL
CO2: 26 mEq/L (ref 19–32)
Calcium: 10.3 mg/dL (ref 8.4–10.5)
Glucose, Bld: 120 mg/dL — ABNORMAL HIGH (ref 70–99)
Sodium: 142 mEq/L (ref 135–145)

## 2012-11-30 MED ORDER — SODIUM CHLORIDE 0.9 % IV BOLUS (SEPSIS)
1000.0000 mL | Freq: Once | INTRAVENOUS | Status: AC
  Administered 2012-11-30: 1000 mL via INTRAVENOUS

## 2012-11-30 MED ORDER — ONDANSETRON 4 MG PO TBDP: 4.0000 mg | ORAL_TABLET | Freq: Three times a day (TID) | ORAL | Status: DC | PRN

## 2012-11-30 NOTE — ED Provider Notes (Signed)
Medical screening examination/treatment/procedure(s) were conducted as a shared visit with non-physician practitioner(s) and myself.  I personally evaluated the patient during the encounter  Please see my separate respective documentation pertaining to this patient encounter   Vida Roller, MD 11/30/12 [phone]

## 2012-11-30 NOTE — ED Provider Notes (Signed)
Pt is 2 months post partum and developed acute onset of diarrhea X 8 episodes several hours ago - this has been persistent, watery and non bloody and associated with fluctuating sharp upper abdominal pain which comes in waves.  She had one episode of vomiting on arrival and at that time felt much better after emesis.  She started a progesterone containing OCP 2 days ago and otherwise has had no other problems.  C section 2 months ago but no post op complications and no more vaginal bleeding.  No f/c/cough/sob/cp/back pain / rash / dysuria / swelling.  Denies food born illness when questioned about meals / exposures.  No travel or recent abx.  On exam she has clear lungs, normal heart rate without murmurs and strong peripheral pulses.  Soft non tender abdomen with normal BS.  No edema and normal MS - she does not appear to be in any discomfort at this time and declines medicines when offered.  IVF, check K level, UA and preg, recheck.  Likely gastroenteritis / colitis.  Medical screening examination/treatment/procedure(s) were conducted as a shared visit with non-physician practitioner(s) and myself.  I personally evaluated the patient during the encounter    Vida Roller, MD 11/30/12 [phone]

## 2012-11-30 NOTE — ED Notes (Signed)
Pt developing severe abdominal pain, cramping sharp mid abdomen that comes and goes, + diarrhea, + vomiting

## 2012-11-30 NOTE — ED Provider Notes (Signed)
History     CSN: [id number]  Arrival date & time 11/30/12  8469   None     Chief Complaint  Patient presents with  . Diarrhea  . Emesis  . Abdominal Pain    (Consider location/radiation/quality/duration/timing/severity/associated sxs/prior treatment) HPI Comments: Pt presents today for persistent diarrhea x 5 hours.  States she ate a normal dinner and felt fine up until about midnight.  She had about 7-8 episodes of liquid diarrhea associated with severe abdominal pain and chills.  Only recent change noted is the start of a new birth control tablet by her OB-GYN.  She is 7 weeks post-partum via cesarean section but denies any complications during delivery.  Is currently breast feeding.  Has had protected sex with spouse since delivery.  Denies any chest pain, SOB, or dizziness at this time.  The history is provided by the patient.    Past Medical History  Diagnosis Date  . No pertinent past medical history   . Depression     Past Surgical History  Procedure Date  . Other surgical history 2002    clipping of tissue/gynecological surgery to assist regular menses  . Tympanostomy tube placement 1988    pt was 27 yrs old    Family History  Problem Relation Age of Onset  . Diabetes Maternal Uncle   . Cancer Paternal Aunt   . Cancer Paternal Grandfather     History  Substance Use Topics  . Smoking status: Never Smoker   . Smokeless tobacco: Never Used  . Alcohol Use: No    OB History    Grav Para Term Preterm Abortions TAB SAB Ect Mult Living   1 1 1  0 0 0 0 0 0 1      Review of Systems  Constitutional: Positive for chills.  Gastrointestinal: Positive for nausea, vomiting and diarrhea (liquid).  All other systems reviewed and are negative.    Allergies  Review of patient's allergies indicates no known allergies.  Home Medications   Current Outpatient Rx  Name  Route  Sig  Dispense  Refill  . ACETAMINOPHEN 500 MG PO TABS   Oral   Take 500 mg by mouth  every 6 (six) hours as needed. Takes for pain         . IBUPROFEN 600 MG PO TABS   Oral   Take 1 tablet (600 mg total) by mouth every 6 (six) hours as needed.   30 tablet   1   . OMEPRAZOLE 20 MG PO CPDR   Oral   Take 20 mg by mouth daily.         . OXYCODONE-ACETAMINOPHEN 5-325 MG PO TABS   Oral   Take 1-2 tablets by mouth every 4 (four) hours as needed (moderate - severe pain).   30 tablet   0   . PRENATAL MULTIVITAMIN CH   Oral   Take 1 tablet by mouth daily.           There were no vitals taken for this visit.  Physical Exam  Nursing note and vitals reviewed. Constitutional: She is oriented to person, place, and time. She appears well-developed and well-nourished.  HENT:  Head: Normocephalic and atraumatic.  Mouth/Throat: Oropharynx is clear and moist.  Eyes: Conjunctivae normal and EOM are normal.  Neck: Normal range of motion. Neck supple.  Cardiovascular: Normal rate, regular rhythm and normal heart sounds.   Pulmonary/Chest: Effort normal and breath sounds normal.  Abdominal: Soft. Bowel sounds are normal. There  is tenderness (mild discomfort suprapubically).  Neurological: She is alert and oriented to person, place, and time.  Skin: Skin is warm and dry.    ED Course  Procedures (including critical care time)  Labs Reviewed  BASIC METABOLIC PANEL - Abnormal; Notable for the following:    Glucose, Bld 120 (*)     GFR calc non Af Amer 87 (*)     All other components within normal limits  PREGNANCY, URINE   No results found.   1. Diarrhea   2. Nausea   3. Vomiting       MDM  5:29 AM Pt evaluated.  BMP, u/a preg pending.  Fluids ordered.  6:17 AM U/A preg negative and BMP unremarkable.  Pt stable and ok for discharge.  Prescription for Zofran given and instructed to fill if needed for nausea.  Instructed to return to the ED for new or worsening symptoms.         Garlon Hatchet, PA-C 11/30/12 [phone]

## 2014-08-31 ENCOUNTER — Encounter (HOSPITAL_BASED_OUTPATIENT_CLINIC_OR_DEPARTMENT_OTHER): Payer: Self-pay | Admitting: Emergency Medicine

## 2014-11-12 ENCOUNTER — Encounter (HOSPITAL_COMMUNITY): Payer: Self-pay | Admitting: Obstetrics and Gynecology

## 2015-04-08 ENCOUNTER — Encounter (HOSPITAL_COMMUNITY): Payer: Self-pay | Admitting: Obstetrics and Gynecology

## 2016-05-19 ENCOUNTER — Emergency Department (HOSPITAL_COMMUNITY): Payer: BC Managed Care – PPO

## 2016-05-19 ENCOUNTER — Encounter (HOSPITAL_BASED_OUTPATIENT_CLINIC_OR_DEPARTMENT_OTHER): Payer: Self-pay | Admitting: *Deleted

## 2016-05-19 ENCOUNTER — Emergency Department (HOSPITAL_BASED_OUTPATIENT_CLINIC_OR_DEPARTMENT_OTHER)
Admission: EM | Admit: 2016-05-19 | Discharge: 2016-05-19 | Disposition: A | Discharge status: Other Acute Inpatient Hospital | Payer: BC Managed Care – PPO | Attending: Emergency Medicine | Admitting: Emergency Medicine

## 2016-05-19 ENCOUNTER — Emergency Department (HOSPITAL_BASED_OUTPATIENT_CLINIC_OR_DEPARTMENT_OTHER): Payer: BC Managed Care – PPO

## 2016-05-19 DIAGNOSIS — K8020 Calculus of gallbladder without cholecystitis without obstruction: Secondary | ICD-10-CM | POA: Diagnosis not present

## 2016-05-19 DIAGNOSIS — R1013 Epigastric pain: Secondary | ICD-10-CM | POA: Diagnosis present

## 2016-05-19 LAB — CBC WITH DIFFERENTIAL/PLATELET
BASOS ABS: 0 10*3/uL (ref 0.0–0.1)
BASOS PCT: 0 %
EOS ABS: 0.1 10*3/uL (ref 0.0–0.7)
Eosinophils Relative: 1 %
HCT: 42.4 % (ref 36.0–46.0)
HEMOGLOBIN: 14.8 g/dL (ref 12.0–15.0)
LYMPHS ABS: 3.1 10*3/uL (ref 0.7–4.0)
Lymphocytes Relative: 34 %
MCH: 31.3 pg (ref 26.0–34.0)
MCHC: 34.9 g/dL (ref 30.0–36.0)
MCV: 89.6 fL (ref 78.0–100.0)
Monocytes Absolute: 0.8 10*3/uL (ref 0.1–1.0)
Monocytes Relative: 8 %
NEUTROS PCT: 57 %
Neutro Abs: 5 10*3/uL (ref 1.7–7.7)
PLATELETS: 295 10*3/uL (ref 150–400)
RBC: 4.73 MIL/uL (ref 3.87–5.11)
RDW: 12.5 % (ref 11.5–15.5)
WBC: 9 10*3/uL (ref 4.0–10.5)

## 2016-05-19 LAB — BASIC METABOLIC PANEL
ANION GAP: 7 (ref 5–15)
BUN: 8 mg/dL (ref 6–20)
CHLORIDE: 105 mmol/L (ref 101–111)
CO2: 27 mmol/L (ref 22–32)
Calcium: 9.5 mg/dL (ref 8.9–10.3)
Creatinine, Ser: 0.68 mg/dL (ref 0.44–1.00)
GFR calc Af Amer: >60 mL/min (ref >60)
GLUCOSE: 108 mg/dL — AB (ref 65–99)
POTASSIUM: 3.2 mmol/L — AB (ref 3.5–5.1)
Sodium: 139 mmol/L (ref 135–145)

## 2016-05-19 LAB — URINALYSIS, ROUTINE W REFLEX MICROSCOPIC
BILIRUBIN URINE: NEGATIVE
Glucose, UA: NEGATIVE mg/dL
Hgb urine dipstick: NEGATIVE
KETONES UR: NEGATIVE mg/dL
NITRITE: NEGATIVE
PH: 6 (ref 5.0–8.0)
Protein, ur: NEGATIVE mg/dL
SPECIFIC GRAVITY, URINE: 1.021 (ref 1.005–1.030)

## 2016-05-19 LAB — URINE MICROSCOPIC-ADD ON

## 2016-05-19 LAB — LIPASE, BLOOD: LIPASE: 32 U/L (ref 11–51)

## 2016-05-19 LAB — PREGNANCY, URINE: Preg Test, Ur: NEGATIVE

## 2016-05-19 MED ORDER — SODIUM CHLORIDE 0.9 % IV BOLUS (SEPSIS)
1000.0000 mL | Freq: Once | INTRAVENOUS | Status: AC
  Administered 2016-05-19: 1000 mL via INTRAVENOUS

## 2016-05-19 MED ORDER — MORPHINE SULFATE (PF) 4 MG/ML IV SOLN
4.0000 mg | Freq: Once | INTRAVENOUS | Status: AC
  Administered 2016-05-19: 4 mg via INTRAVENOUS
  Filled 2016-05-19: qty 1

## 2016-05-19 MED ORDER — GI COCKTAIL ~~LOC~~
30.0000 mL | Freq: Once | ORAL | Status: AC
  Administered 2016-05-19: 30 mL via ORAL
  Filled 2016-05-19: qty 30

## 2016-05-19 MED ORDER — ONDANSETRON HCL 4 MG/2ML IJ SOLN
4.0000 mg | Freq: Once | INTRAMUSCULAR | Status: AC
  Administered 2016-05-19: 4 mg via INTRAVENOUS
  Filled 2016-05-19: qty 2

## 2016-05-19 MED ORDER — FENTANYL CITRATE (PF) 100 MCG/2ML IJ SOLN
50.0000 ug | Freq: Once | INTRAMUSCULAR | Status: AC
  Administered 2016-05-19: 50 ug via INTRAVENOUS
  Filled 2016-05-19: qty 2

## 2016-05-19 MED ORDER — HYDROCODONE-ACETAMINOPHEN 5-325 MG PO TABS: 1.0000 | ORAL_TABLET | ORAL | Status: DC | PRN

## 2016-05-19 MED ORDER — MORPHINE SULFATE (PF) 4 MG/ML IV SOLN
4.0000 mg | Freq: Once | INTRAVENOUS | Status: DC
  Filled 2016-05-19: qty 1

## 2016-05-19 MED ORDER — IOPAMIDOL (ISOVUE-300) INJECTION 61%
100.0000 mL | Freq: Once | INTRAVENOUS | Status: AC | PRN
Start: 2016-05-19 — End: 2016-05-19
  Administered 2016-05-19: 100 mL via INTRAVENOUS

## 2016-05-19 NOTE — ED Notes (Signed)
Ultrasound at bedside

## 2016-05-19 NOTE — ED Provider Notes (Signed)
Sent from [facility] for US RUQ to evaluate for cholelithiasis. 30 year old female with no past abdominal surgeries who presents with sudden onset of epigastric abdominal pain at 1 AM. No prior history of post prandial abdominal pain. No fevers, chills, nausea, vomiting, diarrhea, urinary complaints. States the pain better controlled after fourth round of pain medication on arrival. Has a soft and nonsurgical abdomen with mild epigastric tenderness. No appreciable Murphy sign at this time. Blood work reviewed. Overall unremarkable CBC, comprehensive metabolic panel, lipase, pregnancy, and urine. Pending RUQ ultrasound. This is visualized. There is evidence of cholelithiasis without cholecystitis. On repeat examination she continues to have a soft and nontender abdomen. She is given surgery referral, and she is felt to be stable for discharge home. Strict return and follow-up instructions reviewed. She expressed understanding of all discharge instructions and felt comfortable with the plan of care.   Krystal Guiseana Duo Inaaya Vellucci, MD 05/19/16 [phone]

## 2016-05-19 NOTE — ED Notes (Signed)
Discharge instructions, follow up care, and rx x1 reviewed with patient. Patient verbalized understanding. 

## 2016-05-19 NOTE — ED Notes (Signed)
MD at bedside. 

## 2016-05-19 NOTE — Discharge Instructions (Signed)
You have been given surgery follow-up above regarding elective surgery.  Return for worsening symptoms, including worsening pain, intractable vomiting, fevers, or any other symptoms concerning to you.    Cholelithiasis Cholelithiasis (also called gallstones) is a form of gallbladder disease in which gallstones form in your gallbladder. The gallbladder is an organ that stores bile made in the liver, which helps digest fats. Gallstones begin as small crystals and slowly grow into stones. Gallstone pain occurs when the gallbladder spasms and a gallstone is blocking the duct. Pain can also occur when a stone passes out of the duct.  RISK FACTORS  Being female.   Having multiple pregnancies. Health care providers sometimes advise removing diseased gallbladders before future pregnancies.   Being obese.  Eating a diet heavy in fried foods and fat.   Being older than 60 years and increasing age.   Prolonged use of medicines containing female hormones.   Having diabetes mellitus.   Rapidly losing weight.   Having a family history of gallstones (heredity).  SYMPTOMS  Nausea.   Vomiting.  Abdominal pain.   Yellowing of the skin (jaundice).   Sudden pain. It may persist from several minutes to several hours.  Fever.   Tenderness to the touch. In some cases, when gallstones do not move into the bile duct, people have no pain or symptoms. These are called "silent" gallstones.  TREATMENT Silent gallstones do not need treatment. In severe cases, emergency surgery may be required. Options for treatment include:  Surgery to remove the gallbladder. This is the most common treatment.  Medicines. These do not always work and may take 6-12 months or more to work.  Shock wave treatment (extracorporeal biliary lithotripsy). In this treatment an ultrasound machine sends shock waves to the gallbladder to break gallstones into smaller pieces that can pass into the intestines or be  dissolved by medicine. HOME CARE INSTRUCTIONS   Only take over-the-counter or prescription medicines for pain, discomfort, or fever as directed by your health care provider.   Follow a low-fat diet until seen again by your health care provider. Fat causes the gallbladder to contract, which can result in pain.   Follow up with your health care provider as directed. Attacks are almost always recurrent and surgery is usually required for permanent treatment.  SEEK IMMEDIATE MEDICAL CARE IF:   Your pain increases and is not controlled by medicines.   You have a fever or persistent symptoms for more than 2-3 days.   You have a fever and your symptoms suddenly get worse.   You have persistent nausea and vomiting.  MAKE SURE YOU:   Understand these instructions.  Will watch your condition.  Will get help right away if you are not doing well or get worse.   This information is not intended to replace advice given to you by your health care provider. Make sure you discuss any questions you have with your health care provider.   Document Released: 10/12/2005 Document Revised: 06/18/2013 Document Reviewed: 04/09/2013 Elsevier Interactive Patient Education [employer]2016 Elsevier Inc.

## 2016-05-19 NOTE — ED Notes (Signed)
Bed: [id number] Expected date:  Expected time:  Means of arrival:  Comments: From [facility]/for HIDA scan

## 2016-05-19 NOTE — ED Provider Notes (Addendum)
CSN: [id number]     Arrival date & time 05/19/16  0210 History   First MD Initiated Contact with Patient 05/19/16 0246     Chief Complaint  Patient presents with  . Abdominal Pain     (Consider location/radiation/quality/duration/timing/severity/associated sxs/prior Treatment) HPI Comments: Patient is a 30 year old female with no significant past medical history. She presents for evaluation of epigastric abdominal pain. This started at about 1 AM (2 hours ago) and woke her from sleep. She denies any fevers or chills. She denies any nausea, vomiting, diarrhea, or constipation. She denies prior abdominal surgery.  Patient is a 30 y.o. female presenting with abdominal pain. The history is provided by the patient.  Abdominal Pain Pain location:  Epigastric Pain quality: stabbing   Pain radiates to:  Back Pain severity:  Severe Onset quality:  Sudden Duration:  2 hours Timing:  Constant Progression:  Unchanged Chronicity:  New Relieved by:  Nothing Worsened by:  Nothing tried   Past Medical History  Diagnosis Date  . No pertinent past medical history   . Depression    Past Surgical History  Procedure Laterality Date  . Other surgical history  2002    clipping of tissue/gynecological surgery to assist regular menses  . Tympanostomy tube placement  1988    pt was 30 yrs old   Family History  Problem Relation Age of Onset  . Diabetes Maternal Uncle   . Cancer Paternal Aunt   . Cancer Paternal Grandfather    Social History  Substance Use Topics  . Smoking status: Never Smoker   . Smokeless tobacco: Never Used  . Alcohol Use: Yes   OB History    Gravida Para Term Preterm AB TAB SAB Ectopic Multiple Living   0 0 0 0 0 0 1     Review of Systems  Gastrointestinal: Positive for abdominal pain.  All other systems reviewed and are negative.     Allergies  Review of patient's allergies indicates no known allergies.  Home Medications   Prior to Admission  medications   Medication Sig Start Date End Date Taking? Authorizing Provider  acetaminophen (TYLENOL) 500 MG tablet Take 500 mg by mouth every 6 (six) hours as needed. Takes for pain    Historical Provider, MD  ibuprofen (ADVIL,MOTRIN) 600 MG tablet Take 1 tablet (600 mg total) by mouth every 6 (six) hours as needed. 10/18/12   Huel Cote, MD  omeprazole (PRILOSEC) 20 MG capsule Take 20 mg by mouth daily.    Historical Provider, MD  ondansetron (ZOFRAN ODT) 4 MG disintegrating tablet Take 1 tablet (4 mg total) by mouth every 8 (eight) hours as needed for nausea. 11/30/12   Garlon Hatchet, PA-C  oxyCODONE-acetaminophen (PERCOCET/ROXICET) 5-325 MG per tablet Take 1-2 tablets by mouth every 4 (four) hours as needed (moderate - severe pain). 10/18/12   Huel Cote, MD  Prenatal Vit-Fe Fumarate-FA (PRENATAL MULTIVITAMIN) TABS Take 1 tablet by mouth daily.    Historical Provider, MD   BP 107/88 mmHg  Pulse 80  Temp(Src) 98 F (36.7 C) (Oral)  Resp 26  Ht  (1.626 m)  Wt 163 lb (73.936 kg)  BMI 27.97 kg/m2  SpO2 100% Physical Exam  Constitutional: She is oriented to person, place, and time. She appears well-developed and well-nourished. No distress.  HENT:  Head: Normocephalic and atraumatic.  Neck: Normal range of motion. Neck supple.  Cardiovascular: Normal rate and regular rhythm.  Exam reveals no gallop and no friction  rub.   No murmur heard. Pulmonary/Chest: Effort normal and breath sounds normal. No respiratory distress. She has no wheezes.  Abdominal: Soft. Bowel sounds are normal. She exhibits no distension and no mass. There is tenderness. There is no rebound and no guarding.  There is tenderness to palpation in the epigastric region. There is no rebound and no guarding.  Musculoskeletal: Normal range of motion.  Neurological: She is alert and oriented to person, place, and time.  Skin: Skin is warm and dry. She is not diaphoretic.  Nursing note and vitals  reviewed.   ED Course  Procedures (including critical care time) Labs Review Labs Reviewed  PREGNANCY, URINE  CBC WITH DIFFERENTIAL/PLATELET  BASIC METABOLIC PANEL  LIPASE, BLOOD  URINALYSIS, ROUTINE W REFLEX MICROSCOPIC (NOT AT [facility])    Imaging Review No results found. I have personally reviewed and evaluated these images and lab results as part of my medical decision-making.    MDM   Final diagnoses:  None    Patient is a 30 year old female who presents with complaints of severe epigastric pain that woke her from sleep at 1 AM. Her laboratory studies are reassuring. There is no fever, no white count, no elevation of her LFTs, and normal lipase. CT scan reveals gallstones, but no evidence for cholecystitis. She has received multiple doses of pain medication with minimal relief. I have personally checked on her multiple times and found her in tears clutching her upper abdomen in the exam room. Due to her ongoing pain that has thus far been unrelieved, [facility] where she will undergo further workup. I feel as though an ultrasound would be appropriate. If this is negative, she may need a HIDA scan.  I've discussed the case with Dr. Andrey CampanileWilson from general surgery who agrees with the above plan. I've also spoken with Dr. Adela LankFloyd who agrees to accept the patient in transfer to the emergency department.    Geoffery Lyonsouglas Takumi Din, MD 05/19/16 [id number]  Geoffery Lyonsouglas Jameer Storie, MD 05/19/16 [phone]

## 2016-05-19 NOTE — ED Notes (Signed)
Woke w upper abd pain radiating to back around 1 am, denies urinary sx,  Denies n/v/d

## 2016-12-02 IMAGING — US US ABDOMEN LIMITED
1 series · 14 of 25 positions shown · non-contrast
Comparison: CT abdomen pelvis dated 05/19/2016

CLINICAL DATA: Abdominal pain since 1 a.m.

EXAM:
US ABDOMEN LIMITED - RIGHT UPPER QUADRANT

[Series 1: us abdomen limited · 0.20mm/px · 14 of 62 slices shown]
[im 1/62]
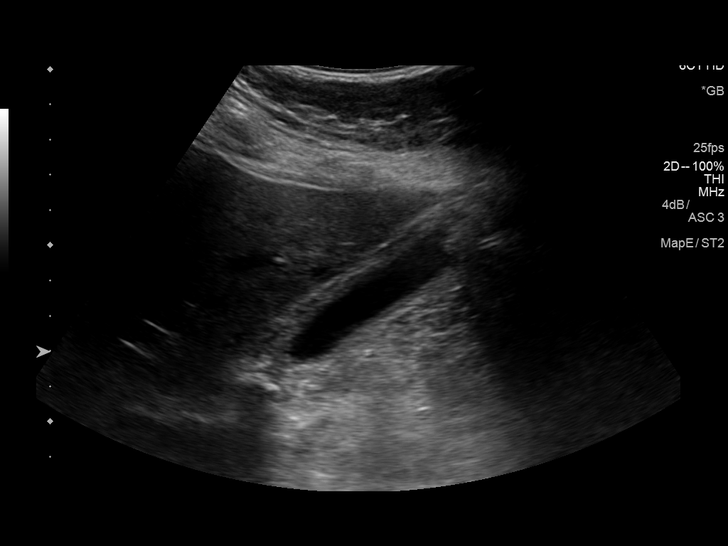
[im 6/62]
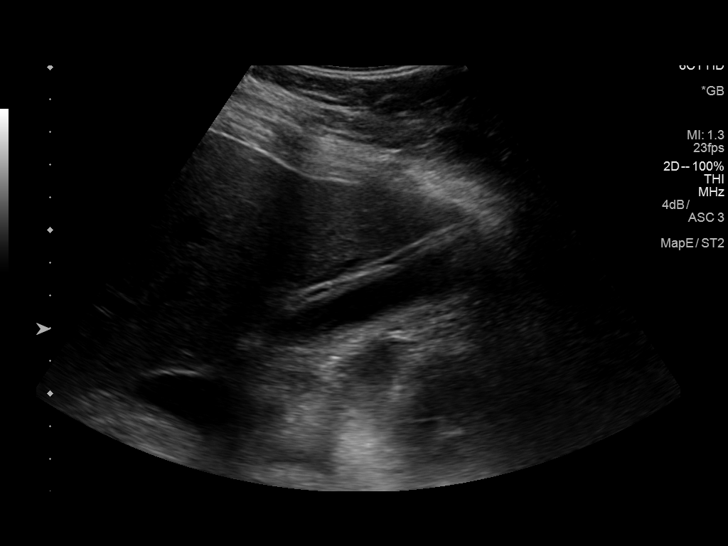
[im 11/62]
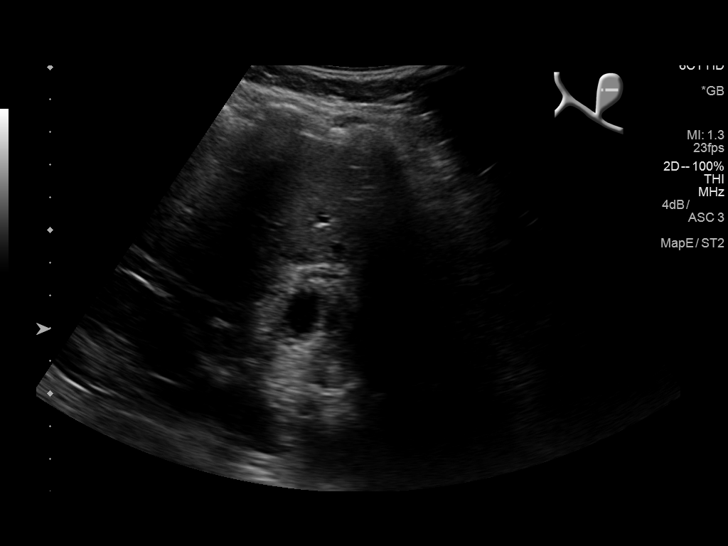
[im 16/62]
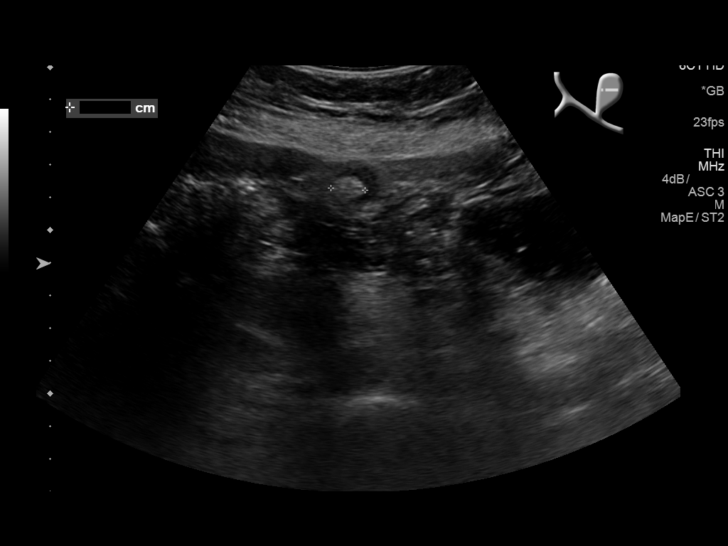
[im 21/62]
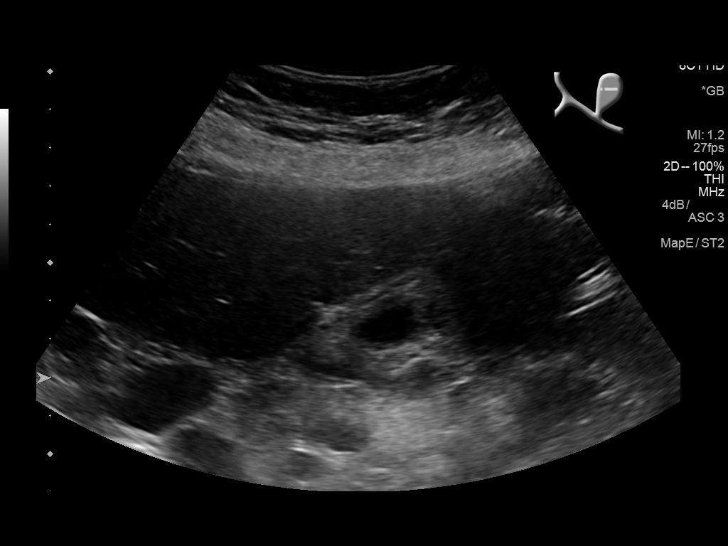
[im 23/62]
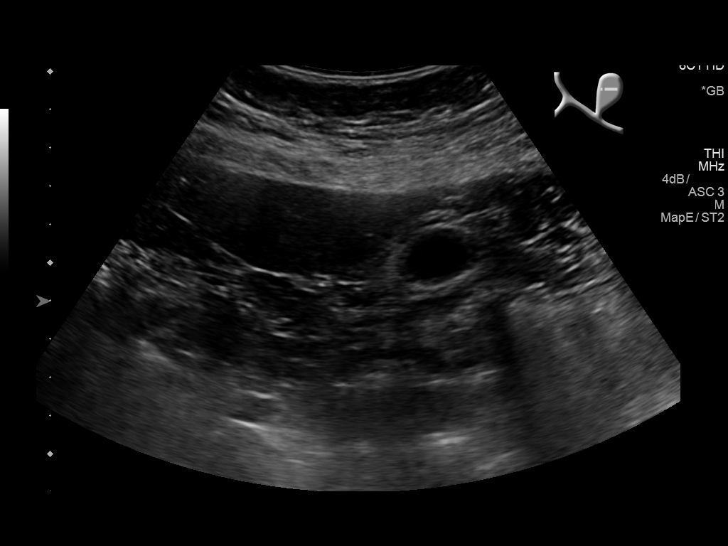
[im 28/62]
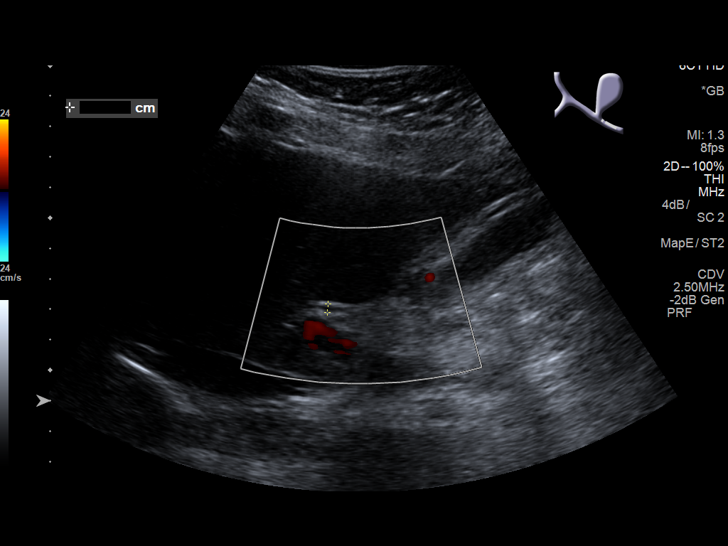
[im 34/62]
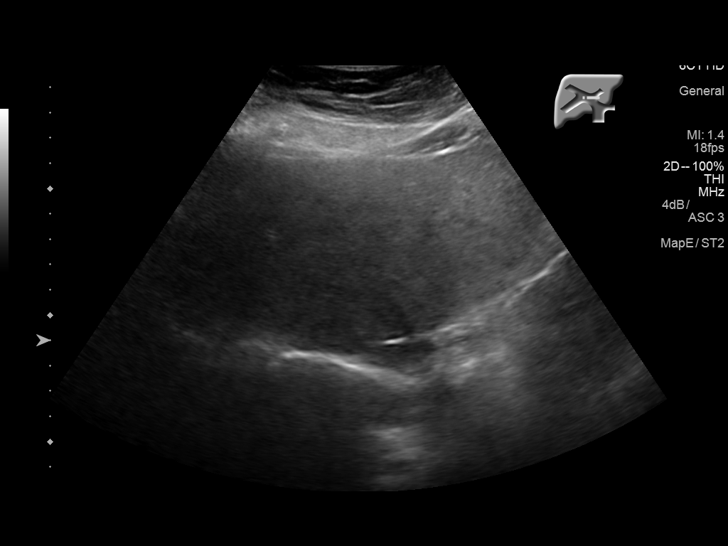
[im 39/62]
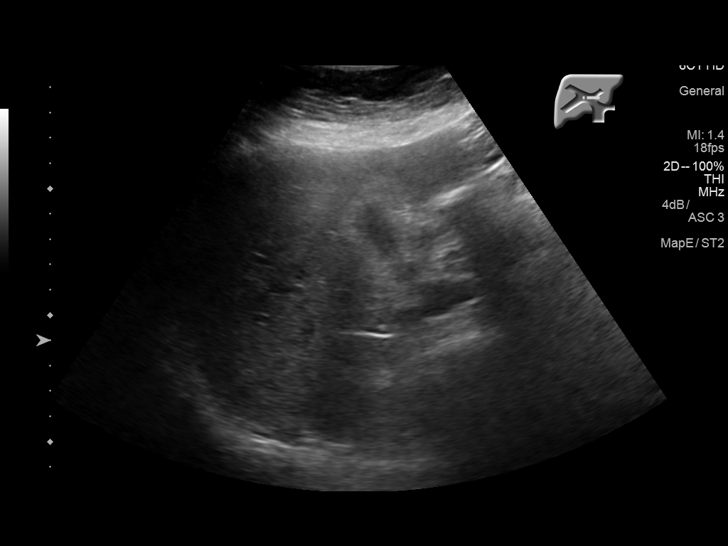
[im 41/62]
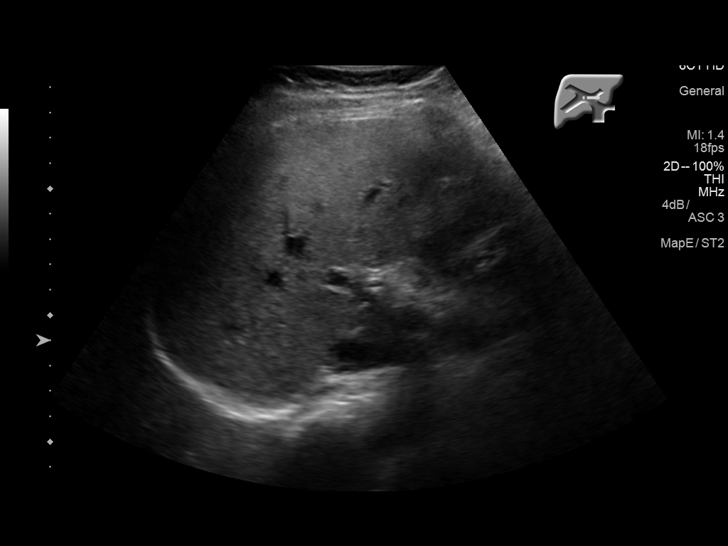
[im 46/62]
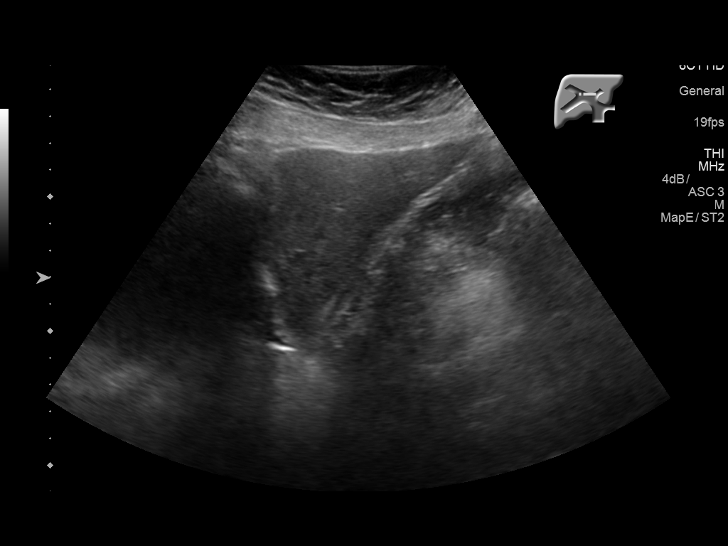
[im 51/62]
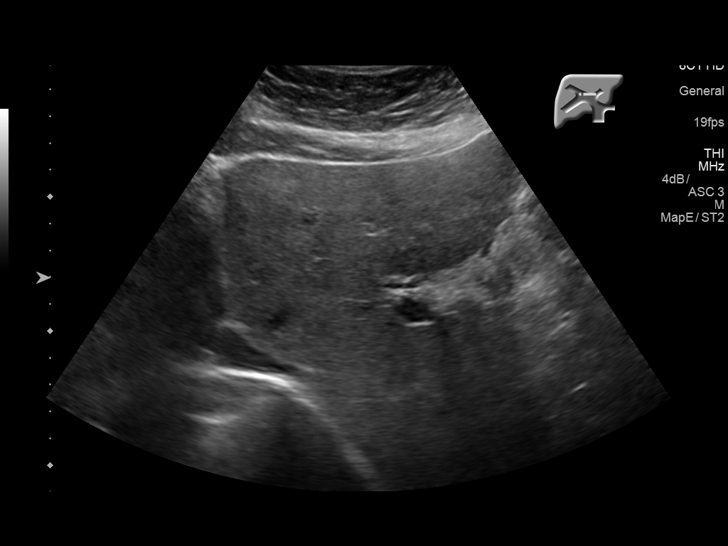
[im 56/62]
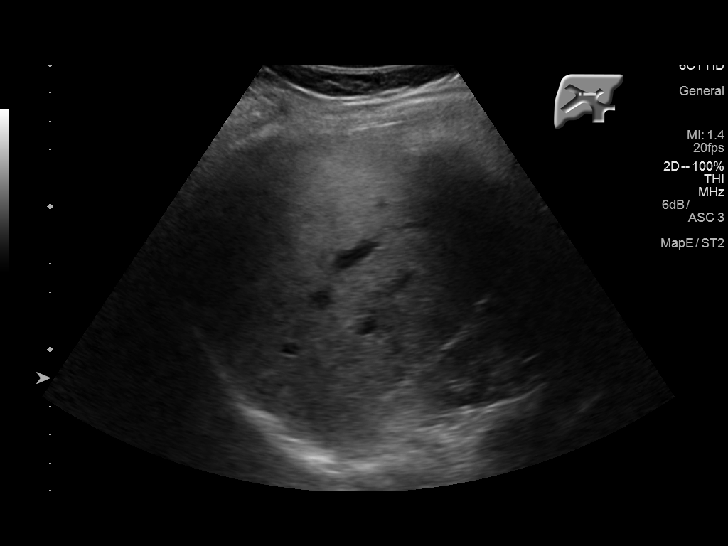
[im 62/62]
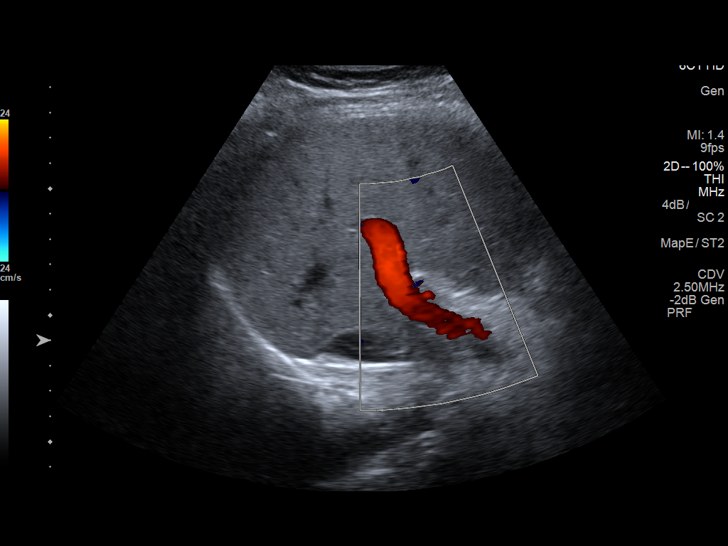

[14 of 25 positions shown; findings below may reference images not displayed]

FINDINGS: Gallbladder:

11 mm gallstone in the gallbladder fundus. Mild gallbladder wall
thickening/edema. No gallbladder distention. Negative sonographic
Murphy's sign, although the patient had been medicated.

Common bile duct:

Diameter: 3 mm

Liver:

Increased parenchymal echogenicity. No focal hepatic lesion is seen.
IMPRESSION: Cholelithiasis with mild gallbladder wall thickening/edema.

This appearance is considered abnormal, and early acute
cholecystitis could have this appearance. However, there is no
gallbladder distention and no definite sonographic Murphy sign.

Consider hepatobiliary nuclear medicine scan for further evaluation
as clinically warranted.

## 2020-05-21 ENCOUNTER — Encounter (HOSPITAL_COMMUNITY): Payer: Self-pay | Admitting: *Deleted

## 2020-05-21 NOTE — Patient Instructions (Signed)
Krystal Stephenson  05/21/2020   Your procedure is scheduled on:  06/02/2020  Arrive at 0630 at [employer] [facility] on [address] at [facility]  and [employer]. You are invited to use the FREE valet parking or use the Visitor's parking deck.  Pick up the phone at the desk and dial [phone].  Call this number if you have problems the morning of surgery: [phone]  Remember:   Do not eat food:(After Midnight) Desps de medianoche.  Do not drink clear liquids: (After Midnight) Desps de medianoche.  Take these medicines the morning of surgery with A SIP OF WATER:  none   Do not wear jewelry, make-up or nail polish.  Do not wear lotions, powders, or perfumes. Do not wear deodorant.  Do not shave 48 hours prior to surgery.  Do not bring valuables to the hospital.  [facility] is not   responsible for any belongings or valuables brought to the hospital.  Contacts, dentures or bridgework may not be worn into surgery.  Leave suitcase in the car. After surgery it may be brought to your room.  For patients admitted to the hospital, checkout time is 11:00 AM the day of              discharge.      Please read over the following fact sheets that you were given:     Preparing for Surgery

## 2020-05-24 ENCOUNTER — Other Ambulatory Visit (HOSPITAL_COMMUNITY): Payer: BC Managed Care – PPO

## 2020-05-24 ENCOUNTER — Encounter (HOSPITAL_COMMUNITY): Payer: Self-pay

## 2020-05-31 ENCOUNTER — Other Ambulatory Visit (HOSPITAL_COMMUNITY)
Admission: RE | Admit: 2020-05-31 | Discharge: 2020-05-31 | Disposition: A | Discharge status: Home / Self Care | Payer: BC Managed Care – PPO | Source: Ambulatory Visit | Attending: Obstetrics and Gynecology | Admitting: Obstetrics and Gynecology

## 2020-05-31 ENCOUNTER — Other Ambulatory Visit: Payer: Self-pay

## 2020-05-31 DIAGNOSIS — Z01812 Encounter for preprocedural laboratory examination: Secondary | ICD-10-CM | POA: Insufficient documentation

## 2020-05-31 DIAGNOSIS — Z20822 Contact with and (suspected) exposure to covid-19: Secondary | ICD-10-CM | POA: Insufficient documentation

## 2020-05-31 LAB — CBC
HCT: 37.2 % (ref 36.0–46.0)
Hemoglobin: 11.8 g/dL — ABNORMAL LOW (ref 12.0–15.0)
MCH: 27.8 pg (ref 26.0–34.0)
MCHC: 31.7 g/dL (ref 30.0–36.0)
MCV: 87.5 fL (ref 80.0–100.0)
Platelets: 229 10*3/uL (ref 150–400)
RBC: 4.25 MIL/uL (ref 3.87–5.11)
RDW: 14.7 % (ref 11.5–15.5)
WBC: 10.8 10*3/uL — ABNORMAL HIGH (ref 4.0–10.5)
nRBC: 0 % (ref 0.0–0.2)

## 2020-05-31 LAB — SARS CORONAVIRUS 2 (TAT 6-24 HRS): SARS Coronavirus 2: NEGATIVE

## 2020-05-31 LAB — TYPE AND SCREEN
ABO/RH(D): A POS
Antibody Screen: NEGATIVE

## 2020-05-31 NOTE — MAU Note (Signed)
Covid swab collected. PT asymptomatic. Waiting for lab to draw CBC/RPR/Type and screen °

## 2020-06-01 LAB — RPR: RPR Ser Ql: NONREACTIVE

## 2020-06-01 NOTE — H&P (View-Only) (Signed)
Krystal Stephenson is a 34 y.o. female, G2 P1001, EGA [redacted] weeks with EDC 8-11 presenting for repeat c-section.  Previous LTCS, CTOL but declines TOL.  PNC otherwise uncomplicated.  OB History    Gravida  2   Para  1   Term  1   Preterm  0   AB  0   Living  1     SAB  0   TAB  0   Ectopic  0   Multiple  0   Live Births  1          Past Medical History:  Diagnosis Date  . Depression    not post partum  . No pertinent past medical history    Past Surgical History:  Procedure Laterality Date  . CHOLECYSTECTOMY    . OTHER SURGICAL HISTORY  2002   clipping of tissue/gynecological surgery to assist regular menses  . TYMPANOSTOMY TUBE PLACEMENT  1988   pt was 34 yrs old   Family History: family history includes Cancer in her paternal aunt and paternal grandfather; Diabetes in her maternal uncle. Social History:  reports that she has never smoked. She has never used smokeless tobacco. She reports current alcohol use. She reports that she does not use drugs.     Maternal Diabetes: No Genetic Screening: Declined Maternal Ultrasounds/Referrals: Normal Fetal Ultrasounds or other Referrals:  None Maternal Substance Abuse:  No Significant Maternal Medications:  None Significant Maternal Lab Results:  Group B Strep positive Other Comments:  None  Review of Systems  Respiratory: Negative.   Cardiovascular: Negative.    Maternal Medical History:  Fetal activity: Perceived fetal activity is normal.    Prenatal complications: no prenatal complications Prenatal Complications - Diabetes: none.      There were no vitals taken for this visit. Maternal Exam:  Abdomen: Patient reports no abdominal tenderness. Surgical scars: low transverse.   Estimated fetal weight is 7.5 lbs.   Fetal presentation: vertex  Introitus: Normal vulva. Normal vagina.  Amniotic fluid character: not assessed.     Physical Exam Constitutional:      Appearance: Normal appearance.   Cardiovascular:     Rate and Rhythm: Normal rate and regular rhythm.     Heart sounds: Normal heart sounds. No murmur heard.   Pulmonary:     Effort: Pulmonary effort is normal. No respiratory distress.     Breath sounds: Normal breath sounds.  Abdominal:     Palpations: Abdomen is soft.  Genitourinary:    General: Normal vulva.  Musculoskeletal:     Cervical back: Normal range of motion and neck supple.  Neurological:     Mental Status: She is alert.     Prenatal labs: ABO, Rh: --/--/A POS (08/02 6237) Antibody: NEG (08/02 0912) Rubella:  immune RPR:  NR  HBsAg:   neg HIV:   NR GBS:   pos  Assessment/Plan: IUP at 39 weeks, previous LTCS declines TOL.  C-section procedure and risks discussed.  Will admit for repeat c-section   Zenaida Niece 06/01/2020, 4:52 AM

## 2020-06-01 NOTE — H&P (Signed)
Krystal Stephenson is a 34 y.o. female, G2 P1001, EGA [redacted] weeks with EDC 8-11 presenting for repeat c-section.  Previous LTCS, CTOL but declines TOL.  PNC otherwise uncomplicated.  OB History    Gravida  2   Para  1   Term  1   Preterm  0   AB  0   Living  1     SAB  0   TAB  0   Ectopic  0   Multiple  0   Live Births  1          Past Medical History:  Diagnosis Date  . Depression    not post partum  . No pertinent past medical history    Past Surgical History:  Procedure Laterality Date  . CHOLECYSTECTOMY    . OTHER SURGICAL HISTORY  2002   clipping of tissue/gynecological surgery to assist regular menses  . TYMPANOSTOMY TUBE PLACEMENT  1988   pt was 34 yrs old   Family History: family history includes Cancer in her paternal aunt and paternal grandfather; Diabetes in her maternal uncle. Social History:  reports that she has never smoked. She has never used smokeless tobacco. She reports current alcohol use. She reports that she does not use drugs.     Maternal Diabetes: No Genetic Screening: Declined Maternal Ultrasounds/Referrals: Normal Fetal Ultrasounds or other Referrals:  None Maternal Substance Abuse:  No Significant Maternal Medications:  None Significant Maternal Lab Results:  Group B Strep positive Other Comments:  None  Review of Systems  Respiratory: Negative.   Cardiovascular: Negative.    Maternal Medical History:  Fetal activity: Perceived fetal activity is normal.    Prenatal complications: no prenatal complications Prenatal Complications - Diabetes: none.      There were no vitals taken for this visit. Maternal Exam:  Abdomen: Patient reports no abdominal tenderness. Surgical scars: low transverse.   Estimated fetal weight is 7.5 lbs.   Fetal presentation: vertex  Introitus: Normal vulva. Normal vagina.  Amniotic fluid character: not assessed.     Physical Exam Constitutional:      Appearance: Normal appearance.   Cardiovascular:     Rate and Rhythm: Normal rate and regular rhythm.     Heart sounds: Normal heart sounds. No murmur heard.   Pulmonary:     Effort: Pulmonary effort is normal. No respiratory distress.     Breath sounds: Normal breath sounds.  Abdominal:     Palpations: Abdomen is soft.  Genitourinary:    General: Normal vulva.  Musculoskeletal:     Cervical back: Normal range of motion and neck supple.  Neurological:     Mental Status: She is alert.     Prenatal labs: ABO, Rh: --/--/A POS (08/02 0912) Antibody: NEG (08/02 0912) Rubella:  immune RPR:  NR  HBsAg:   neg HIV:   NR GBS:   pos  Assessment/Plan: IUP at 39 weeks, previous LTCS declines TOL.  C-section procedure and risks discussed.  Will admit for repeat c-section   Nailani Full D Rilen Shukla 06/01/2020, 4:52 AM    

## 2020-06-02 ENCOUNTER — Inpatient Hospital Stay (HOSPITAL_COMMUNITY)
Admission: RE | Admit: 2020-06-02 | Payer: BC Managed Care – PPO | Source: Home / Self Care | Admitting: Obstetrics and Gynecology

## 2020-06-02 ENCOUNTER — Encounter (HOSPITAL_COMMUNITY)
Admission: RE | Disposition: A | Discharge status: Home / Self Care | Payer: Self-pay | Source: Home / Self Care | Attending: Obstetrics and Gynecology

## 2020-06-02 ENCOUNTER — Inpatient Hospital Stay (HOSPITAL_COMMUNITY): Payer: BC Managed Care – PPO | Admitting: Anesthesiology

## 2020-06-02 ENCOUNTER — Inpatient Hospital Stay (HOSPITAL_COMMUNITY)
Admission: RE | Admit: 2020-06-02 | Discharge: 2020-06-05 | DRG: 788 | Disposition: A | Discharge status: Home / Self Care | Payer: BC Managed Care – PPO | Attending: Obstetrics and Gynecology | Admitting: Obstetrics and Gynecology

## 2020-06-02 ENCOUNTER — Encounter (HOSPITAL_COMMUNITY): Payer: Self-pay | Admitting: Obstetrics and Gynecology

## 2020-06-02 ENCOUNTER — Other Ambulatory Visit: Payer: Self-pay

## 2020-06-02 DIAGNOSIS — Z3A39 39 weeks gestation of pregnancy: Secondary | ICD-10-CM | POA: Diagnosis not present

## 2020-06-02 DIAGNOSIS — O34211 Maternal care for low transverse scar from previous cesarean delivery: Secondary | ICD-10-CM | POA: Diagnosis present

## 2020-06-02 DIAGNOSIS — Z20822 Contact with and (suspected) exposure to covid-19: Secondary | ICD-10-CM | POA: Diagnosis present

## 2020-06-02 DIAGNOSIS — O99824 Streptococcus B carrier state complicating childbirth: Secondary | ICD-10-CM | POA: Diagnosis present

## 2020-06-02 DIAGNOSIS — Z98891 History of uterine scar from previous surgery: Secondary | ICD-10-CM

## 2020-06-02 SURGERY — Surgical Case
Anesthesia: Spinal

## 2020-06-02 MED ORDER — KETOROLAC TROMETHAMINE 30 MG/ML IJ SOLN: 30.0000 mg | Freq: Four times a day (QID) | INTRAMUSCULAR | Status: DC

## 2020-06-02 MED ORDER — CEFAZOLIN SODIUM-DEXTROSE 2-3 GM-%(50ML) IV SOLR
INTRAVENOUS | Status: DC | PRN
  Administered 2020-06-02: 2 g via INTRAVENOUS

## 2020-06-02 MED ORDER — METHYLERGONOVINE MALEATE 0.2 MG/ML IJ SOLN: 0.2000 mg | INTRAMUSCULAR | Status: DC | PRN

## 2020-06-02 MED ORDER — SENNOSIDES-DOCUSATE SODIUM 8.6-50 MG PO TABS
2.0000 | ORAL_TABLET | ORAL | Status: DC
  Administered 2020-06-02 – 2020-06-05 (×3): 2 via ORAL
  Filled 2020-06-02 (×3): qty 2

## 2020-06-02 MED ORDER — DIPHENHYDRAMINE HCL 50 MG/ML IJ SOLN: 12.5000 mg | INTRAMUSCULAR | Status: DC | PRN

## 2020-06-02 MED ORDER — SIMETHICONE 80 MG PO CHEW: 80.0000 mg | CHEWABLE_TABLET | ORAL | Status: DC | PRN

## 2020-06-02 MED ORDER — METHYLERGONOVINE MALEATE 0.2 MG PO TABS: 0.2000 mg | ORAL_TABLET | ORAL | Status: DC | PRN

## 2020-06-02 MED ORDER — MORPHINE SULFATE (PF) 0.5 MG/ML IJ SOLN
INTRAMUSCULAR | Status: DC | PRN
  Administered 2020-06-02: .15 mg via INTRATHECAL

## 2020-06-02 MED ORDER — COCONUT OIL OIL: 1.0000 "application " | TOPICAL_OIL | Status: DC | PRN

## 2020-06-02 MED ORDER — BUPIVACAINE IN DEXTROSE 0.75-8.25 % IT SOLN
INTRATHECAL | Status: DC | PRN
  Administered 2020-06-02: 1.6 mL via INTRATHECAL

## 2020-06-02 MED ORDER — ACETAMINOPHEN 500 MG PO TABS
1000.0000 mg | ORAL_TABLET | Freq: Four times a day (QID) | ORAL | Status: AC
  Administered 2020-06-02 – 2020-06-03 (×4): 1000 mg via ORAL
  Filled 2020-06-02 (×4): qty 2

## 2020-06-02 MED ORDER — DIPHENHYDRAMINE HCL 25 MG PO CAPS: 25.0000 mg | ORAL_CAPSULE | ORAL | Status: DC | PRN

## 2020-06-02 MED ORDER — ACETAMINOPHEN 500 MG PO TABS: 1000.0000 mg | ORAL_TABLET | Freq: Four times a day (QID) | ORAL | Status: DC

## 2020-06-02 MED ORDER — KETOROLAC TROMETHAMINE 30 MG/ML IJ SOLN: 30.0000 mg | Freq: Four times a day (QID) | INTRAMUSCULAR | Status: DC | PRN

## 2020-06-02 MED ORDER — PRENATAL MULTIVITAMIN CH
1.0000 | ORAL_TABLET | Freq: Every day | ORAL | Status: DC
  Administered 2020-06-02 – 2020-06-05 (×4): 1 via ORAL
  Filled 2020-06-02 (×4): qty 1

## 2020-06-02 MED ORDER — PHENYLEPHRINE HCL-NACL 20-0.9 MG/250ML-% IV SOLN
INTRAVENOUS | Status: DC | PRN
  Administered 2020-06-02: 60 ug/min via INTRAVENOUS

## 2020-06-02 MED ORDER — TETANUS-DIPHTH-ACELL PERTUSSIS 5-2.5-18.5 LF-MCG/0.5 IM SUSP: 0.5000 mL | Freq: Once | INTRAMUSCULAR | Status: DC

## 2020-06-02 MED ORDER — WITCH HAZEL-GLYCERIN EX PADS: 1.0000 "application " | MEDICATED_PAD | CUTANEOUS | Status: DC | PRN

## 2020-06-02 MED ORDER — LACTATED RINGERS IV SOLN: INTRAVENOUS | Status: DC

## 2020-06-02 MED ORDER — NALBUPHINE HCL 10 MG/ML IJ SOLN: 5.0000 mg | INTRAMUSCULAR | Status: DC | PRN

## 2020-06-02 MED ORDER — NALOXONE HCL 4 MG/10ML IJ SOLN
1.0000 ug/kg/h | INTRAVENOUS | Status: DC | PRN
  Filled 2020-06-02: qty 5

## 2020-06-02 MED ORDER — KETOROLAC TROMETHAMINE 30 MG/ML IJ SOLN
30.0000 mg | Freq: Four times a day (QID) | INTRAMUSCULAR | Status: AC
  Administered 2020-06-02 – 2020-06-03 (×3): 30 mg via INTRAVENOUS
  Filled 2020-06-02 (×4): qty 1

## 2020-06-02 MED ORDER — SODIUM CHLORIDE 0.9% FLUSH: 3.0000 mL | INTRAVENOUS | Status: DC | PRN

## 2020-06-02 MED ORDER — POVIDONE-IODINE 10 % EX SWAB
2.0000 "application " | Freq: Once | CUTANEOUS | Status: AC
  Administered 2020-06-02: 2 via TOPICAL

## 2020-06-02 MED ORDER — DIPHENHYDRAMINE HCL 25 MG PO CAPS: 25.0000 mg | ORAL_CAPSULE | Freq: Four times a day (QID) | ORAL | Status: DC | PRN

## 2020-06-02 MED ORDER — OXYTOCIN-SODIUM CHLORIDE 30-0.9 UT/500ML-% IV SOLN: 2.5000 [IU]/h | INTRAVENOUS | Status: AC

## 2020-06-02 MED ORDER — IBUPROFEN 800 MG PO TABS
800.0000 mg | ORAL_TABLET | Freq: Three times a day (TID) | ORAL | Status: DC
  Administered 2020-06-03 – 2020-06-05 (×6): 800 mg via ORAL
  Filled 2020-06-02 (×6): qty 1

## 2020-06-02 MED ORDER — NALOXONE HCL 0.4 MG/ML IJ SOLN: 0.4000 mg | INTRAMUSCULAR | Status: DC | PRN

## 2020-06-02 MED ORDER — MEPERIDINE HCL 25 MG/ML IJ SOLN: 6.2500 mg | INTRAMUSCULAR | Status: DC | PRN

## 2020-06-02 MED ORDER — MENTHOL 3 MG MT LOZG
1.0000 | LOZENGE | OROMUCOSAL | Status: DC | PRN
  Administered 2020-06-04: 3 mg via ORAL
  Filled 2020-06-02: qty 9

## 2020-06-02 MED ORDER — PROMETHAZINE HCL 25 MG/ML IJ SOLN: 6.2500 mg | INTRAMUSCULAR | Status: DC | PRN

## 2020-06-02 MED ORDER — KETOROLAC TROMETHAMINE 30 MG/ML IJ SOLN
30.0000 mg | Freq: Once | INTRAMUSCULAR | Status: AC | PRN
  Administered 2020-06-02: 30 mg via INTRAVENOUS

## 2020-06-02 MED ORDER — HYDROMORPHONE HCL 1 MG/ML IJ SOLN: 0.2500 mg | INTRAMUSCULAR | Status: DC | PRN

## 2020-06-02 MED ORDER — MAGNESIUM HYDROXIDE 400 MG/5ML PO SUSP: 30.0000 mL | ORAL | Status: DC | PRN

## 2020-06-02 MED ORDER — NALBUPHINE HCL 10 MG/ML IJ SOLN: 5.0000 mg | Freq: Once | INTRAMUSCULAR | Status: DC | PRN

## 2020-06-02 MED ORDER — ZOLPIDEM TARTRATE 5 MG PO TABS: 5.0000 mg | ORAL_TABLET | Freq: Every evening | ORAL | Status: DC | PRN

## 2020-06-02 MED ORDER — CEFAZOLIN SODIUM-DEXTROSE 2-4 GM/100ML-% IV SOLN: 2.0000 g | INTRAVENOUS | Status: DC

## 2020-06-02 MED ORDER — DIBUCAINE (PERIANAL) 1 % EX OINT: 1.0000 "application " | TOPICAL_OINTMENT | CUTANEOUS | Status: DC | PRN

## 2020-06-02 MED ORDER — OXYCODONE HCL 5 MG PO TABS
5.0000 mg | ORAL_TABLET | ORAL | Status: DC | PRN
  Administered 2020-06-03 – 2020-06-04 (×4): 5 mg via ORAL
  Administered 2020-06-04: 10 mg via ORAL
  Administered 2020-06-04: 5 mg via ORAL
  Filled 2020-06-02 (×2): qty 1
  Filled 2020-06-02: qty 2
  Filled 2020-06-02 (×4): qty 1

## 2020-06-02 MED ORDER — FENTANYL CITRATE (PF) 100 MCG/2ML IJ SOLN
INTRAMUSCULAR | Status: DC | PRN
  Administered 2020-06-02: 15 ug via INTRATHECAL

## 2020-06-02 MED ORDER — MEASLES, MUMPS & RUBELLA VAC IJ SOLR: 0.5000 mL | Freq: Once | INTRAMUSCULAR | Status: DC

## 2020-06-02 MED ORDER — ONDANSETRON HCL 4 MG/2ML IJ SOLN: 4.0000 mg | Freq: Three times a day (TID) | INTRAMUSCULAR | Status: DC | PRN

## 2020-06-02 MED ORDER — ONDANSETRON HCL 4 MG/2ML IJ SOLN
INTRAMUSCULAR | Status: DC | PRN
  Administered 2020-06-02: 4 mg via INTRAVENOUS

## 2020-06-02 MED ORDER — OXYTOCIN-SODIUM CHLORIDE 30-0.9 UT/500ML-% IV SOLN
INTRAVENOUS | Status: DC | PRN
  Administered 2020-06-02: 150 mL via INTRAVENOUS

## 2020-06-02 MED ORDER — SCOPOLAMINE 1 MG/3DAYS TD PT72
1.0000 | MEDICATED_PATCH | Freq: Once | TRANSDERMAL | Status: AC
  Administered 2020-06-02: 1.5 mg via TRANSDERMAL

## 2020-06-02 MED ORDER — IBUPROFEN 800 MG PO TABS: 800.0000 mg | ORAL_TABLET | Freq: Three times a day (TID) | ORAL | Status: DC

## 2020-06-02 SURGICAL SUPPLY — 33 items
BENZOIN TINCTURE PRP APPL 2/3 (GAUZE/BANDAGES/DRESSINGS) ×3 IMPLANT
CHLORAPREP W/TINT 26ML (MISCELLANEOUS) ×3 IMPLANT
CLAMP CORD UMBIL (MISCELLANEOUS) IMPLANT
CLOSURE WOUND 1/2 X4 (GAUZE/BANDAGES/DRESSINGS) ×1
CLOTH BEACON ORANGE TIMEOUT ST (SAFETY) ×3 IMPLANT
DRSG OPSITE POSTOP 4X10 (GAUZE/BANDAGES/DRESSINGS) ×3 IMPLANT
ELECT REM PT RETURN 9FT ADLT (ELECTROSURGICAL) ×3
ELECTRODE REM PT RTRN 9FT ADLT (ELECTROSURGICAL) ×1 IMPLANT
EXTRACTOR VACUUM KIWI (MISCELLANEOUS) IMPLANT
EXTRACTOR VACUUM M CUP 4 TUBE (SUCTIONS) IMPLANT
EXTRACTOR VACUUM M CUP 4' TUBE (SUCTIONS)
GLOVE BIOGEL PI IND STRL 7.0 (GLOVE) ×1 IMPLANT
GLOVE BIOGEL PI INDICATOR 7.0 (GLOVE) ×2
GLOVE ORTHO TXT STRL SZ7.5 (GLOVE) ×3 IMPLANT
GOWN STRL REUS W/TWL LRG LVL3 (GOWN DISPOSABLE) ×6 IMPLANT
KIT ABG SYR 3ML LUER SLIP (SYRINGE) IMPLANT
NEEDLE HYPO 25X5/8 SAFETYGLIDE (NEEDLE) ×3 IMPLANT
NS IRRIG 1000ML POUR BTL (IV SOLUTION) ×3 IMPLANT
PACK C SECTION WH (CUSTOM PROCEDURE TRAY) ×3 IMPLANT
PAD OB MATERNITY 4.3X12.25 (PERSONAL CARE ITEMS) ×3 IMPLANT
PENCIL SMOKE EVAC W/HOLSTER (ELECTROSURGICAL) ×3 IMPLANT
RTRCTR C-SECT PINK 25CM LRG (MISCELLANEOUS) ×3 IMPLANT
STRIP CLOSURE SKIN 1/2X4 (GAUZE/BANDAGES/DRESSINGS) ×2 IMPLANT
SUT CHROMIC 1 CTX 36 (SUTURE) ×6 IMPLANT
SUT PLAIN 0 NONE (SUTURE) IMPLANT
SUT PLAIN 2 0 XLH (SUTURE) ×3 IMPLANT
SUT VIC AB 0 CT1 27 (SUTURE) ×4
SUT VIC AB 0 CT1 27XBRD ANBCTR (SUTURE) ×2 IMPLANT
SUT VIC AB 2-0 CT1 (SUTURE) ×3 IMPLANT
SUT VIC AB 4-0 KS 27 (SUTURE) IMPLANT
TOWEL OR 17X24 6PK STRL BLUE (TOWEL DISPOSABLE) ×3 IMPLANT
TRAY FOLEY W/BAG SLVR 14FR LF (SET/KITS/TRAYS/PACK) ×3 IMPLANT
WATER STERILE IRR 1000ML POUR (IV SOLUTION) ×3 IMPLANT

## 2020-06-02 NOTE — Interval H&P Note (Signed)
History and Physical Interval Note:  06/02/2020 8:08 AM  Krystal Stephenson  has presented today for surgery, with the diagnosis of repeat C-Section.  The various methods of treatment have been discussed with the patient and family. After consideration of risks, benefits and other options for treatment, the patient has consented to  Procedure(s) with comments: CESAREAN SECTION (N/A) - Tracey RNFA as a surgical intervention.  The patient's history has been reviewed, patient examined, no change in status, stable for surgery.  I have reviewed the patient's chart and labs.  Questions were answered to the patient's satisfaction.     Leighton Roach Chelby Salata

## 2020-06-02 NOTE — Lactation Note (Signed)
This note was copied from a baby's chart. Lactation Consultation Note  Patient Name: Krystal Stephenson UTMLY'Y Date: 06/02/2020 Reason for consult: Initial assessment Baby 11hrs old, mom sitting in bed RN taking blood pressure, dad sitting in chair holding baby. Mom reports breastfeeding x52yr (child is now 34yo), used a nipple shield to help achieve latch, otherwise no issues. Mom reports last feeding ~5p, attempted to latch baby to breast, unsure if adequate feeding. Assisted with latching baby to right breast in football and cross cradle hold, baby opens mouth, goes to breast, baby latched twice with 2-3 sucks but looses hold. This LC reviewed hand expression, mom easily expressed half teaspoon of colostrum, LC fed back to baby via spoon. Attempted to put baby back to breast, unsuccessful, baby too fussy. Hand expressed another half teaspoon and fed back to baby. Discussed cue based feedings, optimal skin to skin, hand express after each feeding/ attempt to the breast. Mom fitted for 66mm NS, encouraged to continue to work on latch next 2-3 feedings, if unable to latch to breast use NS. Mom has a DEBP set up in room, encouraged to pump for stimulation if baby unable to latch to breast or if using nipple shield for feedings. Breast shells given d/t flat nipples, encouraged to wear while awake, instructions given, also discussed the option of pre-pump with hand pump to help pull out nipple before latching. Advised how to clean supplies. Mom advised no LC overnight, call RN if with BF needs, otherwise will f/u in the AM. Mom voiced understanding and with no further concerns. Left the room with mom holding baby skin to skin and dad at bedside. BGilliam, RN, IBCLC  Maternal Data Formula Feeding for Exclusion: No Has patient been taught Hand Expression?: Yes Does the patient have breastfeeding experience prior to this delivery?: Yes  Feeding Feeding Type: Breast Fed  LATCH Score Latch: Too sleepy or  reluctant, no latch achieved, no sucking elicited.  Audible Swallowing: None  Type of Nipple: Flat  Comfort (Breast/Nipple): Soft / non-tender  Hold (Positioning): Assistance needed to correctly position infant at breast and maintain latch.  LATCH Score: 4  Interventions Interventions: Skin to skin;Assisted with latch;Breast feeding basics reviewed;Adjust position;Support pillows;Position options;Expressed milk  Lactation Tools Discussed/Used WIC Program: No   Consult Status Consult Status: Follow-up Date: 06/03/20 Follow-up type: In-patient    Charlynn Court 06/02/2020, 8:24 PM

## 2020-06-02 NOTE — Op Note (Signed)
Preoperative diagnosis: Intrauterine pregnancy at 39 weeks, previous c-section Postoperative diagnosis: Same Procedure: Repeat low transverse cesarean section without extensions Surgeon: Lavina Hamman M.D. Assistant:  Genice Rouge, RNFA Anesthesia: Spinal  Findings: Patient had normal gravid anatomy and delivered a viable female infant with Apgars of 9 and 9 weight pending Estimated blood loss: 359 cc Specimens: Placenta sent to labor and delivery Complications: None  Procedure in detail: The patient was taken to the operating room and placed in the sitting position. Dr. Arby Barrette instilled spinal anesthesia.  She was then placed in the dorsosupine position with left tilt. Abdomen was then prepped and draped in the usual sterile fashion, and a foley catheter was inserted. The level of her anesthesia was found to be adequate. Abdomen was entered via a standard Pfannenstiel incision. Some bladder flap adhesions had to be released sharply.  Once the peritoneal cavity was clear the Alexis disposable self-retaining retractor was placed and good visualization was achieved. Further bladder flap adhesions were taken down sharply.  A 4 cm transverse incision was then made in the lower uterine segment pushing the bladder inferior. Once the uterine cavity was entered the incision was extended digitally, clear amniotic fluid. The fetal vertex was grasped and delivered through the incision atraumatically. Mouth and nares were suctioned. The remainder of the infant then delivered atraumatically. Cord was doubly clamped and cut after one minute and the infant handed to the awaiting pediatric team. Cord blood was obtained. The placenta delivered spontaneously. Uterus was wiped dry with clean lap pad and all clots and debris were removed. Uterine incision was inspected and found to be free of extensions. Uterine incision was closed in 1 layer with running locking #1 Chromic. Tubes and ovaries were inspected and found to  be normal. Uterine incision was inspected and found to be hemostatic. Bleeding from serosal edges was controlled with electrocautery. The Alexis retractor was removed. Subfascial space was irrigated and made hemostatic with electrocautery. Peritoneum was closed with 2-0 Vicryl.  Fascia was closed in running fashion starting at both ends and meeting in the middle with 0 Vicryl. Subcutaneous tissue was then irrigated and made hemostatic with electrocautery, then closed with running 2-0 plain gut. Skin was closed with running 4-0 Vicryl subcuticular suture followed by steri-strips and a sterile dressing. Patient tolerated the procedure well and was taken to the recovery in stable condition. Counts were correct x2, she received Ancef 2 g IV at the beginning of the procedure and she had PAS hose on throughout the procedure.

## 2020-06-02 NOTE — Anesthesia Preprocedure Evaluation (Signed)
Anesthesia Evaluation  Patient identified by MRN, date of birth, ID band Patient awake    Reviewed: Allergy & Precautions, H&P , NPO status , Patient's Chart, lab work & pertinent test results  Airway Mallampati: II  TM Distance: >3 FB Neck ROM: full    Dental no notable dental hx. (+) Teeth Intact   Pulmonary neg pulmonary ROS,    Pulmonary exam normal breath sounds clear to auscultation       Cardiovascular negative cardio ROS Normal cardiovascular exam Rhythm:regular Rate:Normal     Neuro/Psych negative neurological ROS     GI/Hepatic negative GI ROS, Neg liver ROS,   Endo/Other  negative endocrine ROS  Renal/GU negative Renal ROS  negative genitourinary   Musculoskeletal negative musculoskeletal ROS (+)   Abdominal (+) + obese,   Peds  Hematology negative hematology ROS (+)   Anesthesia Other Findings   Reproductive/Obstetrics (+) Pregnancy                             Anesthesia Physical Anesthesia Plan  ASA: II  Anesthesia Plan: Spinal   Post-op Pain Management:    Induction:   PONV Risk Score and Plan: 3 and Ondansetron, Dexamethasone and Scopolamine patch - Pre-op  Airway Management Planned: Natural Airway and Nasal Cannula  Additional Equipment: None  Intra-op Plan:   Post-operative Plan:   Informed Consent: I have reviewed the patients History and Physical, chart, labs and discussed the procedure including the risks, benefits and alternatives for the proposed anesthesia with the patient or authorized representative who has indicated his/her understanding and acceptance.       Plan Discussed with: CRNA  Anesthesia Plan Comments:         Anesthesia Quick Evaluation

## 2020-06-02 NOTE — Transfer of Care (Signed)
Immediate Anesthesia Transfer of Care Note  Patient: Krystal Stephenson  Procedure(s) Performed: CESAREAN SECTION (N/A )  Patient Location: PACU  Anesthesia Type:Spinal  Level of Consciousness: awake, alert  and oriented  Airway & Oxygen Therapy: Patient Spontanous Breathing  Post-op Assessment: Report given to RN and Post -op Vital signs reviewed and stable  Post vital signs: Reviewed and stable  Last Vitals:  Vitals Value Taken Time  BP 89/60 06/02/20 0935  Temp    Pulse 87 06/02/20 0939  Resp 17 06/02/20 0939  SpO2 97 % 06/02/20 0939  Vitals shown include unvalidated device data.  Last Pain:  Vitals:   06/02/20 0700  TempSrc: Oral         Complications: No complications documented.

## 2020-06-02 NOTE — Anesthesia Procedure Notes (Signed)
Spinal  Patient location during procedure: OR Start time: 06/02/2020 8:25 AM End time: 06/02/2020 8:27 AM Staffing Performed: anesthesiologist  Anesthesiologist: Leilani Able, MD Preanesthetic Checklist Completed: patient identified, IV checked, site marked, risks and benefits discussed, surgical consent, monitors and equipment checked, pre-op evaluation and timeout performed Spinal Block Patient position: sitting Prep: DuraPrep and site prepped and draped Patient monitoring: continuous pulse ox and blood pressure Approach: midline Location: L3-4 Injection technique: single-shot Needle Needle type: Pencan  Needle gauge: 24 G Needle length: 10 cm Needle insertion depth: 7 cm Assessment Sensory level: T4

## 2020-06-02 NOTE — Anesthesia Postprocedure Evaluation (Signed)
Anesthesia Post Note  Patient: Krystal Stephenson  Procedure(s) Performed: CESAREAN SECTION (N/A )     Patient location during evaluation: PACU Anesthesia Type: Spinal Level of consciousness: awake Pain management: pain level controlled Vital Signs Assessment: post-procedure vital signs reviewed and stable Respiratory status: spontaneous breathing Cardiovascular status: stable Postop Assessment: no headache, no backache, spinal receding, patient able to bend at knees and no apparent nausea or vomiting Anesthetic complications: no   No complications documented.  Last Vitals:  Vitals:   06/02/20 1043 06/02/20 1054  BP: 96/69 91/66  Pulse: 87 80  Resp: 20 20  Temp:  36.6 C  SpO2: 99% 99%    Last Pain:  Vitals:   06/02/20 1124  TempSrc:   PainSc: 0-No pain   Pain Goal:                Epidural/Spinal Function Cutaneous sensation: Tingles (06/02/20 1124), Patient able to flex knees: Yes (06/02/20 1124), Patient able to lift hips off bed: No (06/02/20 1124), Back pain beyond tenderness at insertion site: No (06/02/20 1124), Progressively worsening motor and/or sensory loss: No (06/02/20 1124), Bowel and/or bladder incontinence post epidural: No (06/02/20 1124)  Caren Macadam

## 2020-06-02 NOTE — Progress Notes (Signed)
Patient ID: Krystal Stephenson, female   DOB: 1986/05/03, 34 y.o.   MRN: [id number] Pt doing well post operatively. Bonding well with baby Reports pain well controlled. Denies any complaints.   Plan: routine pp/post op care

## 2020-06-03 LAB — CBC
HCT: 29.3 % — ABNORMAL LOW (ref 36.0–46.0)
Hemoglobin: 9.3 g/dL — ABNORMAL LOW (ref 12.0–15.0)
MCH: 28.5 pg (ref 26.0–34.0)
MCHC: 31.7 g/dL (ref 30.0–36.0)
MCV: 89.9 fL (ref 80.0–100.0)
Platelets: 190 10*3/uL (ref 150–400)
RBC: 3.26 MIL/uL — ABNORMAL LOW (ref 3.87–5.11)
RDW: 15 % (ref 11.5–15.5)
WBC: 8.5 10*3/uL (ref 4.0–10.5)
nRBC: 0 % (ref 0.0–0.2)

## 2020-06-03 LAB — BIRTH TISSUE RECOVERY COLLECTION (PLACENTA DONATION)

## 2020-06-03 MED ORDER — ACETAMINOPHEN 500 MG PO TABS
1000.0000 mg | ORAL_TABLET | Freq: Four times a day (QID) | ORAL | Status: DC | PRN
  Administered 2020-06-03: 1000 mg via ORAL
  Filled 2020-06-03: qty 2

## 2020-06-03 NOTE — Lactation Note (Signed)
This note was copied from a baby's chart. Lactation Consultation Note  Patient Name: Krystal Stephenson [id number] Date: 06/03/2020 Reason for consult: Follow-up assessment;Term;Difficult latch  Visited with mom of a 82 hours old FT female, RN Jeanice Lim called lactation because mom needed assistance with the latch. LC offered assistance when she entered the room but mom was sleeping. Talked to dad and he said baby has been latching with the NS and that mom has been pumping about every 3 hours.   Bilirubin has started to go up, RN Jeanice Lim also reported to [facility] that the last diaper that baby had was orange. She's been helping mom with the latch but baby has been very sleepy; therefore parents have been doing spoon feedings but only getting about 2-3 ml and not at every feeding, only sporadically; mom is exhausted.  Offered assistance with latch after mom woke up and she agreed to wake baby up to feed. LC tried latching without the NS first and baby kept slipping off the breast. Then tried with the NS # 20 (she also has a # 24 in her room) and this time baby did a lot better. Mom had previously hand expressed a drop of colostrum prior latching baby with NS. She was able to complete a 15 minutes session at the breast; but got interrupted when staff came to get her labs, baby started crying and would not calm down until being put STS to mother's chest.  Even though baby latched on and sucked at the breast; no colostrum was noted on the shield. Explained to parents that pumping and hand expression will be crucial in order to keep up with baby's intake. LC also made them aware that we have donor milk in addition to formula, in case they would like to offer baby some calories until mom's supply is in. Will observe next set of labs for bilirubin. Parents agreeable with feeding plan and will call lactation PRN.  Feeding plan:  1. Encouraged mom to keep trying feeding baby STS 8-12 times/24 hours or sooner if feeding  cues are present; using NS PRN 2. Mom will pump every 3 hours and will offer any amount of EBM she may get 3. Parents will wait for next lab results to see bilirubin trend before starting supplementation most likely with donor milk. If things go well, they'll just watch baby until tomorrow and reassess feeding plan  Dad present and very supportive. Parents reported all questions and concerns were answered, they're both aware of LC OP services and will call PRN.   Maternal Data    Feeding Feeding Type: Breast Fed  LATCH Score Latch: Repeated attempts needed to sustain latch, nipple held in mouth throughout feeding, stimulation needed to elicit sucking reflex. (with and without NS # 20. Baby did better with NS though)  Audible Swallowing: None (no colostrum noted on NS # 20)  Type of Nipple: Flat  Comfort (Breast/Nipple): Soft / non-tender  Hold (Positioning): Assistance needed to correctly position infant at breast and maintain latch.  LATCH Score: 5  Interventions Interventions: Breast feeding basics reviewed;Assisted with latch;Skin to skin;Breast massage;Hand express;Breast compression;Adjust position;Support pillows  Lactation Tools Discussed/Used Tools: Nipple Shields Nipple shield size: 20   Consult Status Consult Status: Follow-up Date: 06/04/20 Follow-up type: In-patient    Dierks Wach Venetia Constable 06/03/2020, 6:04 PM

## 2020-06-03 NOTE — Lactation Note (Signed)
This note was copied from a baby's chart. Lactation Consultation Note  Patient Name: Krystal Stephenson [id number] Date: 06/03/2020 Reason for consult: Follow-up assessment;Term  P2 mother whose infant is now 95 hours old.  This is a term baby at 39+0 weeks.  Baby was asleep on mother's chest when I arrived.  Parents were eating breakfast.  Mother has started wearing her breast shells this morning.  She has a manual pump at bedside and will pre-pump to help evert nipple prior to latching.  Mother has been able to hand express colostrum and is feeding all EBM back to baby.  Encouraged her to call for latch assistance as needed.  Mother also has a NS and a DEBP set up in room.  She will pump for 15 minutes if baby does not latch or if she has to use the NS.  Mother had no questions on pump.  She has ordered a DEBP for home use.  Father assisting with care.   Maternal Data    Feeding    LATCH Score                   Interventions    Lactation Tools Discussed/Used     Consult Status Consult Status: Follow-up Date: 06/04/20 Follow-up type: In-patient    Rosabelle Jupin R Mikael Skoda 06/03/2020, 9:16 AM

## 2020-06-03 NOTE — Progress Notes (Addendum)
Reinforced teaching with patient regarding breast feeding infant on demand and at the very least every 2-3 hours, then pumping for 10-15 minutes after. RN then advised to give infant breast milk from pumping via syringe or spoon to make sure infant receiving intake. Donor breast milk supplementation and formula discussed/offered. Patient and FOB verbalized understanding and declined supplementation at this time. Continue to monitor.  Spoke with Selena Batten in nursery regarding bili=10 and feedings today. Recommended supplementing. Mother and father both wanted to wait until baby is weighed this evening before supplementing. Continue to monitor.

## 2020-06-03 NOTE — Progress Notes (Signed)
Subjective: Postpartum Day 1: Cesarean Delivery Patient reports tolerating PO, + flatus and no problems voiding.  Ambulating  Objective: Vital signs in last 24 hours: Temp:  [97.6 F (36.4 C)-98.8 F (37.1 C)] 98 F (36.7 C) (08/05 0738) Pulse Rate:  [73-122] 75 (08/05 0738) Resp:  [15-20] 18 (08/05 0738) BP: (88-104)/(49-76) 89/60 (08/05 0738) SpO2:  [95 %-100 %] 95 % (08/05 0738)  Physical Exam:  General: alert and cooperative Lochia: appropriate Uterine Fundus: firm Incision: C/D/I   No results for input(s): HGB, HCT in the last 72 hours.  Assessment/Plan: Status post Cesarean section. Doing well postoperatively.  Continue current care. CBC not drawn yet this AM, will have RN call lab  Krystal Stephenson 06/03/2020, 9:35 AM

## 2020-06-04 MED ORDER — OXYCODONE HCL 5 MG PO TABS: 5.0000 mg | ORAL_TABLET | ORAL | 0 refills | Status: DC | PRN

## 2020-06-04 MED ORDER — IBUPROFEN 800 MG PO TABS: 800.0000 mg | ORAL_TABLET | Freq: Three times a day (TID) | ORAL | 0 refills | Status: DC

## 2020-06-04 NOTE — Lactation Note (Signed)
This note was copied from a baby's chart. Lactation Consultation Note  Patient Name: Krystal Stephenson [id number] Date: 06/04/2020 Reason for consult: Follow-up assessment  P2 mother whose infant is now 58 hours old.  This is a term baby at 39+0 weeks.  Baby had a bilirubin level of 10.8mg /dl at 45 hours of life.  This is at the low end of high intermediate risk zone.  Parents have started supplementing with formula.  Originally, mother did not wish to supplement.  However, she has started supplementing with Lucien Mons Start formula as of 2050 last evening.  Baby has consumed large volumes of formula and father stated that the last bowel movement has been a transition stool (green, instead of meconium)  Mother continues to breast feed using the NS and has been pumping.  She is just starting to see a few colostrum drops, however, she does not see any drops in the NS tip.    Encouraged to continue breast feeding, supplementing and post pumping.  Offered to show how to instill formula in the NS tip using a curved tip syringe at the next feeding to help encourage baby to latch and suck well at the breast.  Mother may call me back if she is interested in trying this.  Parents stated that their daughter has no difficulty with consuming the formula and is content after feedings.  Mother has already made a first pediatric visit for Monday and parents are hoping for a discharge today.  Encouraged continued feeding and giving back any EBM mother obtains to baby prior to using formula.  Mother verbalizied understanding.  Mother has ordered a DEBP for home use. Father present and supportive.             MOTHER'S INFORMATION  Name: [name], [name] Name: <not on file>  MRN: [id number]    SSN: [id number] DOB: [birth year]   Maternal Data    Feeding Feeding Type: Bottle Fed - Formula Nipple Type: Slow - flow  LATCH Score                   Interventions    Lactation Tools  Discussed/Used     Consult Status Consult Status: Follow-up Date: 06/05/20 Follow-up type: In-patient    Linda Grimmer R Khamari Yousuf 06/04/2020, 9:46 AM

## 2020-06-04 NOTE — Discharge Instructions (Signed)
As per discharge pamphlet °

## 2020-06-04 NOTE — Discharge Summary (Signed)
Postpartum Discharge Summary      Patient Name: Krystal Stephenson DOB: 06-Jul-1986 MRN: [id number]  Date of admission: 06/02/2020 Delivery date:06/02/2020  Delivering provider: Jackelyn Knife, Journey Castonguay  Date of discharge: 06/05/2020  Admitting diagnosis: S/P cesarean section [Z98.891] Maternal care due to low transverse uterine scar from previous cesarean delivery [O34.211] Intrauterine pregnancy: [redacted]w[redacted]d     Secondary diagnosis:  Active Problems:   S/P cesarean section   Maternal care due to low transverse uterine scar from previous cesarean delivery     Discharge diagnosis: Term Pregnancy Delivered                                              Hospital course: Sceduled C/S   34 y.o. yo G2P2002 at [redacted]w[redacted]d was admitted to the hospital 06/02/2020 for scheduled cesarean section with the following indication:Elective Repeat.Delivery details are as follows:  Membrane Rupture Time/Date: 8:54 AM ,06/02/2020   Delivery Method:C-Section, Low Transverse  Details of operation can be found in separate operative note.  Patient had an uncomplicated postpartum course.  She is ambulating, tolerating a regular diet, passing flatus, and urinating well. Patient is discharged home in stable condition on  06/05/20        Newborn Data: Birth date:06/02/2020  Birth time:8:54 AM  Gender:Female  Living status:Living  Apgars:10 ,10  Weight:3940 g      Physical exam  Vitals:   06/04/20 1943 06/04/20 2249 06/05/20 0700 06/05/20 0743  BP: 110/69 110/70 110/69 99/63  Pulse: 88 84 79 79  Resp: 15 16 16 16   Temp: 98.2 F (36.8 C) 98 F (36.7 C) 98 F (36.7 C) 98 F (36.7 C)  TempSrc: Oral Oral  Oral  SpO2: 98% 97% 96% 99%  Weight:      Height:       General: alert Lochia: appropriate Uterine Fundus: firm Incision: Healing well with no significant drainage  Labs: Lab Results  Component Value Date   WBC 8.5 06/03/2020   HGB 9.3 (L) 06/03/2020   HCT 29.3 (L) 06/03/2020   MCV 89.9 06/03/2020   PLT 190 06/03/2020    CMP Latest Ref Rng & Units 05/19/2016  Glucose 65 - 99 mg/dL 05/21/2016)  BUN 6 - 20 mg/dL 8  Creatinine 235(T - 7.32 mg/dL 2.02  Sodium 5.42 - 706 mmol/L 139  Potassium 3.5 - 5.1 mmol/L 3.2(L)  Chloride 101 - 111 mmol/L 105  CO2 22 - 32 mmol/L 27  Calcium 8.9 - 10.3 mg/dL 9.5   Edinburgh Score: Edinburgh Postnatal Depression Scale Screening Tool 06/04/2020  I have been able to laugh and see the funny side of things. 0  I have looked forward with enjoyment to things. 0  I have blamed myself unnecessarily when things went wrong. 1  I have been anxious or worried for no good reason. 1  I have felt scared or panicky for no good reason. 0  Things have been getting on top of me. 0  I have been so unhappy that I have had difficulty sleeping. 0  I have felt sad or miserable. 1  I have been so unhappy that I have been crying. 0  The thought of harming myself has occurred to me. 0  Edinburgh Postnatal Depression Scale Total 3      After visit meds:  Allergies as of 06/05/2020   No Known Allergies  Medication List    TAKE these medications   acetaminophen 325 MG tablet Commonly known as: TYLENOL Take 650 mg by mouth every 6 (six) hours as needed for mild pain or headache.   ibuprofen 800 MG tablet Commonly known as: ADVIL Take 1 tablet (800 mg total) by mouth every 8 (eight) hours.   oxyCODONE 5 MG immediate release tablet Commonly known as: Oxy IR/ROXICODONE Take 1 tablet (5 mg total) by mouth every 4 (four) hours as needed for severe pain.   prenatal multivitamin Tabs tablet Take 1 tablet by mouth daily.        Discharge home in stable condition Infant Feeding: Breast Infant Disposition:home with mother Discharge instruction: per After Visit Summary and Postpartum booklet. Activity: Advance as tolerated. Pelvic rest for 6 weeks.  Diet: routine diet  Postpartum Appointment:2 weeks Follow up Visit:  Follow-up Information    Edit Ricciardelli, MD. Schedule an appointment  as soon as possible for a visit in 2 week(s).   Specialty: Obstetrics and Gynecology Why: Incision check Contact information: [address] [place] [address] [phone]                   06/05/2020 Zenaida Niece, MD

## 2020-06-04 NOTE — Progress Notes (Signed)
POD #2 LTCS Doing well, some pain, baby being evaluated for jaundice Afeb, VSS Abd- soft, fundus firm, incision intact Continue routine care, d/c home this pm if baby able to go

## 2020-06-05 NOTE — Lactation Note (Signed)
This note was copied from a baby's chart. Lactation Consultation Note  Patient Name: Krystal Stephenson [id number] Date: 06/05/2020 Reason for consult: Follow-up assessment;Term  LC in to visit with P2 Mom of term baby at 25 hrs old.  Baby at 4.8% weight loss and having good output.    Mom states she is feeling much better about baby's feedings at the breast, using a 20 mm nipple shield to help with latch.  Mom has been consistently pumping after feeding and expressing about 10 ml of colostrum.    Encouraged continued STS and feeding baby often with cues.  Mom has been formula feeding by slow flow paced bottle.    Mom is expecting her insurance DEBP to arrive, but states she has a hand pump as well at home.  Mom to take all her pump parts home with her.  Mom aware of [employer] DEBP available to rent from [employer] in hospital lobby from 10-2p today.   Mom encouraged to have OP lactation follow-up.  Her pediatrician has an IBCLC Research scientist (life sciences)), who she plans to meet up with next week.   Engorgement prevention and treatment reviewed.  Mom aware of OP Lactation support available to her and encouraged to call prn.   Interventions Interventions: Breast feeding basics reviewed;Skin to skin;Breast massage;Hand express;DEBP  Lactation Tools Discussed/Used Tools: Pump Breast pump type: Double-Electric Breast Pump   Consult Status Consult Status: Complete Date: 06/05/20    Krystal Stephenson 06/05/2020, 8:40 AM

## 2020-06-05 NOTE — Progress Notes (Signed)
Pt discharged with baby and all belongings in stable condition.

## 2020-06-05 NOTE — Progress Notes (Signed)
Pt given discharge instructions. All questions answered.

## 2020-06-05 NOTE — Progress Notes (Signed)
POD #3 LTCS Doing well, stayed yesterday due to issues with baby Afeb, VSS Abd- soft, fundus firm, incision intact D/c home

## 2021-07-18 LAB — OB RESULTS CONSOLE VARICELLA ZOSTER ANTIBODY, IGG: Varicella: IMMUNE

## 2021-07-18 LAB — OB RESULTS CONSOLE ANTIBODY SCREEN: Antibody Screen: NEGATIVE

## 2021-07-18 LAB — OB RESULTS CONSOLE HIV ANTIBODY (ROUTINE TESTING): HIV: NONREACTIVE

## 2021-07-18 LAB — HEPATITIS C ANTIBODY: HCV Ab: NEGATIVE

## 2021-07-18 LAB — OB RESULTS CONSOLE RUBELLA ANTIBODY, IGM: Rubella: IMMUNE

## 2021-07-18 LAB — OB RESULTS CONSOLE GC/CHLAMYDIA
Chlamydia: NEGATIVE
Gonorrhea: NEGATIVE

## 2021-07-18 LAB — OB RESULTS CONSOLE RPR: RPR: NONREACTIVE

## 2021-07-18 LAB — OB RESULTS CONSOLE ABO/RH: RH Type: POSITIVE

## 2021-07-18 LAB — OB RESULTS CONSOLE HEPATITIS B SURFACE ANTIGEN: Hepatitis B Surface Ag: NEGATIVE

## 2021-11-01 LAB — OB RESULTS CONSOLE GBS: GBS: POSITIVE

## 2021-11-07 ENCOUNTER — Encounter (HOSPITAL_COMMUNITY): Payer: Self-pay | Admitting: *Deleted

## 2021-11-07 ENCOUNTER — Telehealth (HOSPITAL_COMMUNITY): Payer: Self-pay | Admitting: *Deleted

## 2021-11-07 NOTE — Patient Instructions (Signed)
Krystal Stephenson  11/07/2021   Your procedure is scheduled on:  11/18/2021  Arrive at 0530 at Entrance C on [address] at [facility]  and [employer]. You are invited to use the FREE valet parking or use the Visitor's parking deck.  Pick up the phone at the desk and dial [phone].  Call this number if you have problems the morning of surgery: [phone]  Remember:   Do not eat food:(After Midnight) Desps de medianoche.  Do not drink clear liquids: (After Midnight) Desps de medianoche.  Take these medicines the morning of surgery with A SIP OF WATER:  none   Do not wear jewelry, make-up or nail polish.  Do not wear lotions, powders, or perfumes. Do not wear deodorant.  Do not shave 48 hours prior to surgery.  Do not bring valuables to the hospital.  [facility] is not   responsible for any belongings or valuables brought to the hospital.  Contacts, dentures or bridgework may not be worn into surgery.  Leave suitcase in the car. After surgery it may be brought to your room.  For patients admitted to the hospital, checkout time is 11:00 AM the day of              discharge.      Please read over the following fact sheets that you were given:     Preparing for Surgery

## 2021-11-07 NOTE — Telephone Encounter (Signed)
Preadmission screen  

## 2021-11-08 ENCOUNTER — Telehealth (HOSPITAL_COMMUNITY): Payer: Self-pay | Admitting: *Deleted

## 2021-11-08 NOTE — Telephone Encounter (Signed)
Preadmission screen  

## 2021-11-09 ENCOUNTER — Telehealth (HOSPITAL_COMMUNITY): Payer: Self-pay | Admitting: *Deleted

## 2021-11-09 NOTE — Telephone Encounter (Signed)
Preadmission screen  

## 2021-11-10 ENCOUNTER — Encounter (HOSPITAL_COMMUNITY): Payer: Self-pay | Admitting: Obstetrics and Gynecology

## 2021-11-10 ENCOUNTER — Inpatient Hospital Stay (HOSPITAL_COMMUNITY)
Admission: AD | Admit: 2021-11-10 | Discharge: 2021-11-10 | Disposition: A | Discharge status: Home / Self Care | Payer: BC Managed Care – PPO | Attending: Obstetrics and Gynecology | Admitting: Obstetrics and Gynecology

## 2021-11-10 ENCOUNTER — Telehealth (HOSPITAL_COMMUNITY): Payer: Self-pay | Admitting: *Deleted

## 2021-11-10 DIAGNOSIS — O471 False labor at or after 37 completed weeks of gestation: Secondary | ICD-10-CM | POA: Insufficient documentation

## 2021-11-10 DIAGNOSIS — O26893 Other specified pregnancy related conditions, third trimester: Secondary | ICD-10-CM | POA: Insufficient documentation

## 2021-11-10 DIAGNOSIS — O479 False labor, unspecified: Secondary | ICD-10-CM

## 2021-11-10 DIAGNOSIS — Z3A37 37 weeks gestation of pregnancy: Secondary | ICD-10-CM | POA: Diagnosis not present

## 2021-11-10 DIAGNOSIS — R197 Diarrhea, unspecified: Secondary | ICD-10-CM | POA: Diagnosis not present

## 2021-11-10 MED ORDER — LACTATED RINGERS IV BOLUS
1000.0000 mL | Freq: Once | INTRAVENOUS | Status: AC
  Administered 2021-11-10: 1000 mL via INTRAVENOUS

## 2021-11-10 NOTE — MAU Note (Signed)
.  Krystal Stephenson is a 36 y.o. at  G3P2 at [redacted]w[redacted]d here in MAU reporting: UC's and diarrhea that started at 0200.  UC's noted to be every couple of minutes to long spaces between. Patient is scheduled for repeat C/S on Friday 11/18/21.  Advised by triage RN to come to MAU for further evaluation.   Pain 5/10  Vitals:   11/10/21 0836  BP: 120/83  Pulse: 82  Resp: 20  Temp: 98 F (36.7 C)     FHT: 145

## 2021-11-10 NOTE — MAU Provider Note (Signed)
Krystal Stephenson is a 36 y.o. G46P2002 female at [redacted]w[redacted]d  RN Labor check, not seen by provider SVE by RN: Dilation: 1 Effacement (%): 70 Station: -3 Exam by:: H Koran RNC NST: FHR baseline 150 bpm, Variability: moderate, Accelerations:present, Decelerations:  Absent= Cat II /Reactive Toco: irritability   No change in cervix over serial checks Feeling better after fluid bolus ordered by private physician D/C home  [name], MD/MPH Attending Family Medicine Physician, [facility] for [facility], [facility]  11/10/2021 11:55 AM

## 2021-11-10 NOTE — Telephone Encounter (Signed)
Preadmission screen  

## 2021-11-11 ENCOUNTER — Encounter (HOSPITAL_COMMUNITY): Payer: Self-pay

## 2021-11-14 ENCOUNTER — Encounter (HOSPITAL_COMMUNITY): Payer: Self-pay | Admitting: Obstetrics and Gynecology

## 2021-11-16 ENCOUNTER — Other Ambulatory Visit: Payer: Self-pay

## 2021-11-16 ENCOUNTER — Other Ambulatory Visit (HOSPITAL_COMMUNITY)
Admission: RE | Admit: 2021-11-16 | Discharge: 2021-11-16 | Disposition: A | Discharge status: Home / Self Care | Payer: BC Managed Care – PPO | Source: Ambulatory Visit | Attending: Obstetrics and Gynecology | Admitting: Obstetrics and Gynecology

## 2021-11-16 ENCOUNTER — Other Ambulatory Visit: Payer: Self-pay | Admitting: Obstetrics and Gynecology

## 2021-11-16 DIAGNOSIS — N858 Other specified noninflammatory disorders of uterus: Secondary | ICD-10-CM | POA: Insufficient documentation

## 2021-11-16 DIAGNOSIS — Z01812 Encounter for preprocedural laboratory examination: Secondary | ICD-10-CM | POA: Insufficient documentation

## 2021-11-16 DIAGNOSIS — O34211 Maternal care for low transverse scar from previous cesarean delivery: Secondary | ICD-10-CM

## 2021-11-16 DIAGNOSIS — Z3A00 Weeks of gestation of pregnancy not specified: Secondary | ICD-10-CM | POA: Insufficient documentation

## 2021-11-16 LAB — CBC
HCT: 37.7 % (ref 36.0–46.0)
Hemoglobin: 12.7 g/dL (ref 12.0–15.0)
MCH: 29.9 pg (ref 26.0–34.0)
MCHC: 33.7 g/dL (ref 30.0–36.0)
MCV: 88.7 fL (ref 80.0–100.0)
Platelets: 270 10*3/uL (ref 150–400)
RBC: 4.25 MIL/uL (ref 3.87–5.11)
RDW: 14 % (ref 11.5–15.5)
WBC: 9.5 10*3/uL (ref 4.0–10.5)
nRBC: 0 % (ref 0.0–0.2)

## 2021-11-16 LAB — RPR: RPR Ser Ql: NONREACTIVE

## 2021-11-16 LAB — TYPE AND SCREEN
ABO/RH(D): A POS
Antibody Screen: NEGATIVE

## 2021-11-16 LAB — SARS CORONAVIRUS 2 (TAT 6-24 HRS): SARS Coronavirus 2: POSITIVE — AB

## 2021-11-16 NOTE — Progress Notes (Signed)
Lab called stating patient has covid positive PCR. Krystal Stephenson on L&D made aware stating she would "make a note of it"

## 2021-11-17 NOTE — Anesthesia Preprocedure Evaluation (Addendum)
Anesthesia Evaluation  Patient identified by MRN, date of birth, ID band Patient awake    Reviewed: Allergy & Precautions, NPO status , Patient's Chart, lab work & pertinent test results  History of Anesthesia Complications Negative for: history of anesthetic complications  Airway Mallampati: II  TM Distance: >3 FB Neck ROM: Full    Dental   Pulmonary asthma (childhood) , Recent URI  (COVID+ 11/16/21),    Pulmonary exam normal        Cardiovascular negative cardio ROS Normal cardiovascular exam     Neuro/Psych negative neurological ROS     GI/Hepatic negative GI ROS, Neg liver ROS,   Endo/Other  negative endocrine ROS  Renal/GU negative Renal ROS     Musculoskeletal negative musculoskeletal ROS (+)   Abdominal   Peds  Hematology negative hematology ROS (+)   Anesthesia Other Findings  Hgb 12.7, plts 270  Reproductive/Obstetrics (+) Pregnancy G3P2002 at [redacted]w[redacted]d Prior C/S x2                           Anesthesia Physical Anesthesia Plan  ASA: 2  Anesthesia Plan: Spinal   Post-op Pain Management:    Induction:   PONV Risk Score and Plan: 3 and Ondansetron and Treatment may vary due to age or medical condition  Airway Management Planned: Natural Airway  Additional Equipment: None  Intra-op Plan:   Post-operative Plan:   Informed Consent: I have reviewed the patients History and Physical, chart, labs and discussed the procedure including the risks, benefits and alternatives for the proposed anesthesia with the patient or authorized representative who has indicated his/her understanding and acceptance.       Plan Discussed with:   Anesthesia Plan Comments:        Anesthesia Quick Evaluation

## 2021-11-17 NOTE — H&P (Signed)
Krystal Stephenson is a 36 y.o. female, G3P2002, EGA [redacted] weeks with [facility] 1-27 presenting for elective repeat c-section.  PNC complicated by late care at 21 weeks, AMA, previous LTCS x 2.  OB History     Gravida  3   Para  2   Term  2   Preterm  0   AB  0   Living  2      SAB  0   IAB  0   Ectopic  0   Multiple  0   Live Births  2          Past Medical History:  Diagnosis Date   Asthma    as a child   Depression    not post partum   No pertinent past medical history    Past Surgical History:  Procedure Laterality Date   CESAREAN SECTION N/A 06/02/2020   Procedure: CESAREAN SECTION;  Surgeon: Krystal Hamman, MD;  Location: [facility] LD ORS;  Service: Obstetrics;  Laterality: N/A;  Krystal Stephenson RNFA   CHOLECYSTECTOMY     OTHER SURGICAL HISTORY  2002   clipping of tissue/gynecological surgery to assist regular menses   TYMPANOSTOMY TUBE PLACEMENT  1988   pt was 36 yrs old   Family History: family history includes Cancer in her paternal aunt and paternal grandfather; Diabetes in her maternal uncle. Social History:  reports that she has never smoked. She has never used smokeless tobacco. She reports current alcohol use. She reports that she does not use drugs.     Maternal Diabetes: No Genetic Screening: Declined Maternal Ultrasounds/Referrals: Normal Fetal Ultrasounds or other Referrals:  None Maternal Substance Abuse:  No Significant Maternal Medications:  None Significant Maternal Lab Results:  Group B Strep positive Other Comments:  None  Review of Systems  Respiratory: Negative.    Cardiovascular: Negative.   Maternal Medical History:  Contractions: Frequency: rare.   Fetal activity: Perceived fetal activity is normal.   Prenatal complications: no prenatal complications Prenatal Complications - Diabetes: none.    unknown if currently breastfeeding. Maternal Exam:  Uterine Assessment: Contraction strength is mild.  Contraction frequency is rare.  Abdomen:  Patient reports no abdominal tenderness. Surgical scars: low transverse.   Estimated fetal weight is 8 lbs.   Fetal presentation: vertex Introitus: Normal vulva. Normal vagina.  Amniotic fluid character: not assessed.  Physical Exam Constitutional:      Appearance: Normal appearance.  Cardiovascular:     Rate and Rhythm: Normal rate and regular rhythm.     Heart sounds: Normal heart sounds. No murmur heard. Pulmonary:     Effort: Pulmonary effort is normal. No respiratory distress.     Breath sounds: Normal breath sounds.  Abdominal:     Palpations: Abdomen is soft.  Genitourinary:    General: Normal vulva.  Musculoskeletal:     Cervical back: Normal range of motion and neck supple.  Neurological:     Mental Status: She is alert.    Prenatal labs: ABO, Rh: --/--/A POS (01/18 3235) Antibody: NEG (01/18 0916) Rubella: Immune (09/19 0000) RPR: NON REACTIVE (01/18 0915)  HBsAg: Negative (09/19 0000)  HIV: Non-reactive (09/19 0000)  GBS: Positive/-- (01/03 0000)   Assessment/Plan: IUP at 39 weeks, previous c-section x 2, AMA.  She desires repeat c-section.  Procedure and risks discussed.  Will admit for repeat c-section.  She is COVID positive on pre-op screening, will take appropriate precautions   Krystal Stephenson 11/17/2021, 9:25 PM

## 2021-11-18 ENCOUNTER — Inpatient Hospital Stay (HOSPITAL_COMMUNITY): Payer: BC Managed Care – PPO | Admitting: Anesthesiology

## 2021-11-18 ENCOUNTER — Encounter (HOSPITAL_COMMUNITY)
Admission: RE | Disposition: A | Discharge status: Home / Self Care | Payer: Self-pay | Source: Home / Self Care | Attending: Obstetrics and Gynecology

## 2021-11-18 ENCOUNTER — Other Ambulatory Visit: Payer: Self-pay

## 2021-11-18 ENCOUNTER — Encounter (HOSPITAL_COMMUNITY): Payer: Self-pay | Admitting: Obstetrics and Gynecology

## 2021-11-18 ENCOUNTER — Inpatient Hospital Stay (HOSPITAL_COMMUNITY)
Admission: RE | Admit: 2021-11-18 | Discharge: 2021-11-21 | DRG: 786 | Disposition: A | Discharge status: Home / Self Care | Payer: BC Managed Care – PPO | Attending: Obstetrics and Gynecology | Admitting: Obstetrics and Gynecology

## 2021-11-18 DIAGNOSIS — Z3A39 39 weeks gestation of pregnancy: Secondary | ICD-10-CM | POA: Diagnosis not present

## 2021-11-18 DIAGNOSIS — O99824 Streptococcus B carrier state complicating childbirth: Secondary | ICD-10-CM | POA: Diagnosis present

## 2021-11-18 DIAGNOSIS — Z98891 History of uterine scar from previous surgery: Secondary | ICD-10-CM

## 2021-11-18 DIAGNOSIS — U071 COVID-19: Secondary | ICD-10-CM | POA: Diagnosis present

## 2021-11-18 DIAGNOSIS — O9852 Other viral diseases complicating childbirth: Secondary | ICD-10-CM | POA: Diagnosis present

## 2021-11-18 DIAGNOSIS — O34211 Maternal care for low transverse scar from previous cesarean delivery: Principal | ICD-10-CM | POA: Diagnosis present

## 2021-11-18 HISTORY — DX: Unspecified asthma, uncomplicated: J45.909

## 2021-11-18 SURGERY — Surgical Case
Anesthesia: Spinal

## 2021-11-18 MED ORDER — FENTANYL CITRATE (PF) 100 MCG/2ML IJ SOLN
INTRAMUSCULAR | Status: AC
  Filled 2021-11-18: qty 2

## 2021-11-18 MED ORDER — KETOROLAC TROMETHAMINE 30 MG/ML IJ SOLN: 30.0000 mg | Freq: Four times a day (QID) | INTRAMUSCULAR | Status: AC | PRN

## 2021-11-18 MED ORDER — SIMETHICONE 80 MG PO CHEW: 80.0000 mg | CHEWABLE_TABLET | ORAL | Status: DC | PRN

## 2021-11-18 MED ORDER — FENTANYL CITRATE (PF) 100 MCG/2ML IJ SOLN
INTRAMUSCULAR | Status: DC | PRN
  Administered 2021-11-18: 15 ug via INTRATHECAL

## 2021-11-18 MED ORDER — MORPHINE SULFATE (PF) 0.5 MG/ML IJ SOLN
INTRAMUSCULAR | Status: DC | PRN
  Administered 2021-11-18: .15 mg via INTRATHECAL

## 2021-11-18 MED ORDER — PROMETHAZINE HCL 25 MG/ML IJ SOLN: 6.2500 mg | INTRAMUSCULAR | Status: DC | PRN

## 2021-11-18 MED ORDER — CEFAZOLIN SODIUM-DEXTROSE 2-4 GM/100ML-% IV SOLN
2.0000 g | INTRAVENOUS | Status: AC
  Administered 2021-11-18: 2 g via INTRAVENOUS

## 2021-11-18 MED ORDER — ACETAMINOPHEN 10 MG/ML IV SOLN
INTRAVENOUS | Status: DC | PRN
  Administered 2021-11-18: 1000 mg via INTRAVENOUS

## 2021-11-18 MED ORDER — CEFAZOLIN SODIUM-DEXTROSE 2-4 GM/100ML-% IV SOLN
INTRAVENOUS | Status: AC
  Filled 2021-11-18: qty 100

## 2021-11-18 MED ORDER — DEXAMETHASONE SODIUM PHOSPHATE 10 MG/ML IJ SOLN
INTRAMUSCULAR | Status: DC | PRN
Start: 2021-11-18 — End: 2021-11-18
  Administered 2021-11-18: 10 mg via INTRAVENOUS

## 2021-11-18 MED ORDER — SCOPOLAMINE 1 MG/3DAYS TD PT72: 1.0000 | MEDICATED_PATCH | Freq: Once | TRANSDERMAL | Status: DC

## 2021-11-18 MED ORDER — KETOROLAC TROMETHAMINE 30 MG/ML IJ SOLN
30.0000 mg | Freq: Four times a day (QID) | INTRAMUSCULAR | Status: AC
  Filled 2021-11-18: qty 1

## 2021-11-18 MED ORDER — OXYCODONE HCL 5 MG PO TABS: 5.0000 mg | ORAL_TABLET | Freq: Once | ORAL | Status: DC | PRN

## 2021-11-18 MED ORDER — DIPHENHYDRAMINE HCL 25 MG PO CAPS: 25.0000 mg | ORAL_CAPSULE | Freq: Four times a day (QID) | ORAL | Status: DC | PRN

## 2021-11-18 MED ORDER — MORPHINE SULFATE (PF) 0.5 MG/ML IJ SOLN
INTRAMUSCULAR | Status: AC
  Filled 2021-11-18: qty 10

## 2021-11-18 MED ORDER — DIBUCAINE (PERIANAL) 1 % EX OINT: 1.0000 "application " | TOPICAL_OINTMENT | CUTANEOUS | Status: DC | PRN

## 2021-11-18 MED ORDER — LACTATED RINGERS IV SOLN: INTRAVENOUS | Status: DC

## 2021-11-18 MED ORDER — DIPHENHYDRAMINE HCL 25 MG PO CAPS: 25.0000 mg | ORAL_CAPSULE | ORAL | Status: DC | PRN

## 2021-11-18 MED ORDER — HYDROMORPHONE HCL 1 MG/ML IJ SOLN: 0.2500 mg | INTRAMUSCULAR | Status: DC | PRN

## 2021-11-18 MED ORDER — NALOXONE HCL 0.4 MG/ML IJ SOLN: 0.4000 mg | INTRAMUSCULAR | Status: DC | PRN

## 2021-11-18 MED ORDER — METHYLERGONOVINE MALEATE 0.2 MG/ML IJ SOLN: 0.2000 mg | INTRAMUSCULAR | Status: DC | PRN

## 2021-11-18 MED ORDER — ONDANSETRON HCL 4 MG/2ML IJ SOLN: 4.0000 mg | Freq: Three times a day (TID) | INTRAMUSCULAR | Status: DC | PRN

## 2021-11-18 MED ORDER — MEASLES, MUMPS & RUBELLA VAC IJ SOLR: 0.5000 mL | Freq: Once | INTRAMUSCULAR | Status: DC

## 2021-11-18 MED ORDER — METHYLERGONOVINE MALEATE 0.2 MG PO TABS: 0.2000 mg | ORAL_TABLET | ORAL | Status: DC | PRN

## 2021-11-18 MED ORDER — WITCH HAZEL-GLYCERIN EX PADS: 1.0000 "application " | MEDICATED_PAD | CUTANEOUS | Status: DC | PRN

## 2021-11-18 MED ORDER — OXYCODONE HCL 5 MG PO TABS
5.0000 mg | ORAL_TABLET | ORAL | Status: DC | PRN
  Administered 2021-11-19 – 2021-11-21 (×4): 5 mg via ORAL
  Filled 2021-11-18 (×5): qty 1

## 2021-11-18 MED ORDER — PHENYLEPHRINE HCL-NACL 20-0.9 MG/250ML-% IV SOLN
INTRAVENOUS | Status: DC | PRN
  Administered 2021-11-18: 60 ug/min via INTRAVENOUS

## 2021-11-18 MED ORDER — NALOXONE HCL 4 MG/10ML IJ SOLN
1.0000 ug/kg/h | INTRAVENOUS | Status: DC | PRN
  Filled 2021-11-18: qty 5

## 2021-11-18 MED ORDER — SODIUM CHLORIDE 0.9% FLUSH: 3.0000 mL | INTRAVENOUS | Status: DC | PRN

## 2021-11-18 MED ORDER — OXYTOCIN-SODIUM CHLORIDE 30-0.9 UT/500ML-% IV SOLN
INTRAVENOUS | Status: DC | PRN
Start: 2021-11-18 — End: 2021-11-18
  Administered 2021-11-18: 300 mL via INTRAVENOUS

## 2021-11-18 MED ORDER — SODIUM CHLORIDE 0.9 % IR SOLN
Status: DC | PRN
  Administered 2021-11-18: 500 mL

## 2021-11-18 MED ORDER — MAGNESIUM HYDROXIDE 400 MG/5ML PO SUSP: 30.0000 mL | ORAL | Status: DC | PRN

## 2021-11-18 MED ORDER — OXYTOCIN-SODIUM CHLORIDE 30-0.9 UT/500ML-% IV SOLN: 2.5000 [IU]/h | INTRAVENOUS | Status: AC

## 2021-11-18 MED ORDER — KETOROLAC TROMETHAMINE 30 MG/ML IJ SOLN
INTRAMUSCULAR | Status: AC
  Filled 2021-11-18: qty 1

## 2021-11-18 MED ORDER — ONDANSETRON HCL 4 MG/2ML IJ SOLN
INTRAMUSCULAR | Status: DC | PRN
  Administered 2021-11-18: 4 mg via INTRAVENOUS

## 2021-11-18 MED ORDER — ACETAMINOPHEN 10 MG/ML IV SOLN: 1000.0000 mg | Freq: Once | INTRAVENOUS | Status: DC | PRN

## 2021-11-18 MED ORDER — MEPERIDINE HCL 25 MG/ML IJ SOLN: 6.2500 mg | INTRAMUSCULAR | Status: DC | PRN

## 2021-11-18 MED ORDER — DIPHENHYDRAMINE HCL 50 MG/ML IJ SOLN: 12.5000 mg | INTRAMUSCULAR | Status: DC | PRN

## 2021-11-18 MED ORDER — ACETAMINOPHEN 500 MG PO TABS
1000.0000 mg | ORAL_TABLET | Freq: Four times a day (QID) | ORAL | Status: DC
  Administered 2021-11-18 – 2021-11-21 (×10): 1000 mg via ORAL
  Filled 2021-11-18 (×11): qty 2

## 2021-11-18 MED ORDER — OXYCODONE HCL 5 MG/5ML PO SOLN: 5.0000 mg | Freq: Once | ORAL | Status: DC | PRN

## 2021-11-18 MED ORDER — ZOLPIDEM TARTRATE 5 MG PO TABS: 5.0000 mg | ORAL_TABLET | Freq: Every evening | ORAL | Status: DC | PRN

## 2021-11-18 MED ORDER — SENNOSIDES-DOCUSATE SODIUM 8.6-50 MG PO TABS
2.0000 | ORAL_TABLET | ORAL | Status: DC
  Administered 2021-11-18 – 2021-11-20 (×3): 2 via ORAL
  Filled 2021-11-18 (×3): qty 2

## 2021-11-18 MED ORDER — PRENATAL MULTIVITAMIN CH
1.0000 | ORAL_TABLET | Freq: Every day | ORAL | Status: DC
  Administered 2021-11-18 – 2021-11-20 (×3): 1 via ORAL
  Filled 2021-11-18 (×3): qty 1

## 2021-11-18 MED ORDER — POVIDONE-IODINE 10 % EX SWAB
2.0000 "application " | Freq: Once | CUTANEOUS | Status: AC
  Administered 2021-11-18: 2 via TOPICAL

## 2021-11-18 MED ORDER — MENTHOL 3 MG MT LOZG: 1.0000 | LOZENGE | OROMUCOSAL | Status: DC | PRN

## 2021-11-18 MED ORDER — COCONUT OIL OIL: 1.0000 "application " | TOPICAL_OIL | Status: DC | PRN

## 2021-11-18 MED ORDER — IBUPROFEN 600 MG PO TABS
600.0000 mg | ORAL_TABLET | Freq: Four times a day (QID) | ORAL | Status: DC
  Administered 2021-11-19 – 2021-11-21 (×8): 600 mg via ORAL
  Filled 2021-11-18 (×8): qty 1

## 2021-11-18 MED ORDER — KETOROLAC TROMETHAMINE 30 MG/ML IJ SOLN
30.0000 mg | Freq: Four times a day (QID) | INTRAMUSCULAR | Status: AC | PRN
  Administered 2021-11-18: 30 mg via INTRAVENOUS

## 2021-11-18 MED ORDER — ACETAMINOPHEN 10 MG/ML IV SOLN
INTRAVENOUS | Status: AC
  Filled 2021-11-18: qty 100

## 2021-11-18 MED ORDER — DEXAMETHASONE SODIUM PHOSPHATE 10 MG/ML IJ SOLN
INTRAMUSCULAR | Status: AC
  Filled 2021-11-18: qty 1

## 2021-11-18 MED ORDER — TETANUS-DIPHTH-ACELL PERTUSSIS 5-2.5-18.5 LF-MCG/0.5 IM SUSY: 0.5000 mL | PREFILLED_SYRINGE | Freq: Once | INTRAMUSCULAR | Status: DC

## 2021-11-18 MED ORDER — PHENYLEPHRINE HCL-NACL 20-0.9 MG/250ML-% IV SOLN
INTRAVENOUS | Status: AC
  Filled 2021-11-18: qty 250

## 2021-11-18 MED ORDER — OXYTOCIN-SODIUM CHLORIDE 30-0.9 UT/500ML-% IV SOLN
INTRAVENOUS | Status: AC
  Filled 2021-11-18: qty 500

## 2021-11-18 MED ORDER — BUPIVACAINE IN DEXTROSE 0.75-8.25 % IT SOLN
INTRATHECAL | Status: DC | PRN
  Administered 2021-11-18: 1.4 mL via INTRATHECAL

## 2021-11-18 MED ORDER — ONDANSETRON HCL 4 MG/2ML IJ SOLN
INTRAMUSCULAR | Status: AC
  Filled 2021-11-18: qty 2

## 2021-11-18 SURGICAL SUPPLY — 32 items
BENZOIN TINCTURE PRP APPL 2/3 (GAUZE/BANDAGES/DRESSINGS) ×1 IMPLANT
CHLORAPREP W/TINT 26ML (MISCELLANEOUS) ×2 IMPLANT
CLAMP CORD UMBIL (MISCELLANEOUS) IMPLANT
CLOSURE STERI STRIP 1/2 X4 (GAUZE/BANDAGES/DRESSINGS) ×1 IMPLANT
CLOTH BEACON ORANGE TIMEOUT ST (SAFETY) ×2 IMPLANT
DRSG OPSITE POSTOP 4X10 (GAUZE/BANDAGES/DRESSINGS) ×2 IMPLANT
ELECT REM PT RETURN 9FT ADLT (ELECTROSURGICAL) ×2
ELECTRODE REM PT RTRN 9FT ADLT (ELECTROSURGICAL) ×1 IMPLANT
EXTRACTOR VACUUM KIWI (MISCELLANEOUS) IMPLANT
EXTRACTOR VACUUM M CUP 4 TUBE (SUCTIONS) IMPLANT
GLOVE BIOGEL PI IND STRL 7.0 (GLOVE) ×1 IMPLANT
GLOVE BIOGEL PI INDICATOR 7.0 (GLOVE) ×1
GLOVE ORTHO TXT STRL SZ7.5 (GLOVE) ×2 IMPLANT
GOWN STRL REUS W/TWL LRG LVL3 (GOWN DISPOSABLE) ×4 IMPLANT
KIT ABG SYR 3ML LUER SLIP (SYRINGE) IMPLANT
NDL HYPO 25X5/8 SAFETYGLIDE (NEEDLE) ×1 IMPLANT
NEEDLE HYPO 25X5/8 SAFETYGLIDE (NEEDLE) ×2 IMPLANT
NS IRRIG 1000ML POUR BTL (IV SOLUTION) ×2 IMPLANT
PACK C SECTION WH (CUSTOM PROCEDURE TRAY) ×2 IMPLANT
PAD OB MATERNITY 4.3X12.25 (PERSONAL CARE ITEMS) ×2 IMPLANT
RETAINER VISCERAL (MISCELLANEOUS) ×1 IMPLANT
RTRCTR C-SECT PINK 25CM LRG (MISCELLANEOUS) ×2 IMPLANT
SUT CHROMIC 1 CTX 36 (SUTURE) ×4 IMPLANT
SUT PLAIN 0 NONE (SUTURE) IMPLANT
SUT PLAIN 2 0 XLH (SUTURE) IMPLANT
SUT VIC AB 0 CT1 27 (SUTURE) ×2
SUT VIC AB 0 CT1 27XBRD ANBCTR (SUTURE) ×2 IMPLANT
SUT VIC AB 2-0 CT1 (SUTURE) ×2 IMPLANT
SUT VIC AB 4-0 KS 27 (SUTURE) IMPLANT
TOWEL OR 17X24 6PK STRL BLUE (TOWEL DISPOSABLE) ×2 IMPLANT
TRAY FOLEY W/BAG SLVR 14FR LF (SET/KITS/TRAYS/PACK) ×2 IMPLANT
WATER STERILE IRR 1000ML POUR (IV SOLUTION) ×2 IMPLANT

## 2021-11-18 NOTE — Anesthesia Procedure Notes (Signed)
Spinal ° °Patient location during procedure: OR °Reason for block: surgical anesthesia °Staffing °Performed: anesthesiologist  °Anesthesiologist: Zaineb Nowaczyk E, MD °Preanesthetic Checklist °Completed: patient identified, IV checked, risks and benefits discussed, surgical consent, monitors and equipment checked, pre-op evaluation and timeout performed °Spinal Block °Patient position: sitting °Prep: DuraPrep and site prepped and draped °Patient monitoring: continuous pulse ox, blood pressure and heart rate °Approach: midline °Location: L3-4 °Injection technique: single-shot °Needle °Needle type: Pencan  °Needle gauge: 24 G °Needle length: 10 cm °Assessment °Events: CSF return °Additional Notes °Functioning IV was confirmed and monitors were applied. Sterile prep and drape, including hand hygiene and sterile gloves were used. The patient was positioned and the spine was prepped. The skin was anesthetized with lidocaine.  Free flow of clear CSF was obtained prior to injecting local anesthetic into the CSF. The needle was carefully withdrawn. The patient tolerated the procedure well.  ° ° ° °

## 2021-11-18 NOTE — Op Note (Signed)
Preoperative diagnosis: Intrauterine pregnancy at 39 weeks, previous c-section x 2 Postoperative diagnosis: Same Procedure: Repeat low transverse cesarean section without extensions Surgeon: Cheri Fowler M.D. Anesthesia: Spinal  Findings: Patient had normal gravid anatomy and delivered a viable female infant with Apgars of 9 and 9, 7 lbs 5 oz prelim Estimated blood loss: 200 cc Specimens: Placenta sent to labor and delivery Complications: None  Procedure in detail: The patient was taken to the operating room and placed in the sitting position. The anesthesiologist instilled spinal anesthesia.  She was then placed in the dorsosupine position with left tilt. Abdomen was then prepped and draped in the usual sterile fashion, and a foley catheter was inserted. The level of her anesthesia was found to be adequate. Abdomen was entered via a standard Pfannenstiel incision through her previous scar, minimal scar tissue in abdominal wall. Once the peritoneal cavity was entered, some bladder flap adhesions were released sharply and the Alexis disposable self-retaining retractor was placed and good visualization was achieved. A 4 cm transverse incision was then made in the lower uterine segment pushing the bladder inferior. Once the uterine cavity was entered the incision was extended digitally, clear amniotic fluid. The fetal vertex was grasped and delivered through the incision atraumatically. Mouth and nares were suctioned. The remainder of the infant then delivered atraumatically. Cord was doubly clamped and cut after one minute and the infant handed to the awaiting pediatric team. Cord blood was obtained. The placenta delivered spontaneously. Uterus was wiped dry with clean lap pad and all clots and debris were removed. Uterine incision was inspected and found to be free of extensions. Uterine incision was closed in 1 layer with running #1 Chromic. Tubes and ovaries were inspected and found to be normal.  Uterine incision was inspected and found to be hemostatic. Bleeding from serosal edges was controlled with electrocautery. The Alexis retractor was removed. Subfascial space was irrigated and made hemostatic with electrocautery. Peritoneum was closed with 2-0 Vicryl.  Fascia was closed in running fashion starting at both ends and meeting in the middle with 0 Vicryl. Subcutaneous tissue was then irrigated and made hemostatic with electrocautery, then closed with running 2-0 plain gut. Skin was closed with running 4-0 Vicryl subcuticular suture followed by steri-strips and a sterile dressing. Patient tolerated the procedure well and was taken to the recovery in stable condition. Counts were correct x2, she received Ancef 2 g IV at the beginning of the procedure and she had PAS hose on throughout the procedure.

## 2021-11-18 NOTE — Transfer of Care (Signed)
Immediate Anesthesia Transfer of Care Note  Patient: Krystal Stephenson  Procedure(s) Performed: CESAREAN SECTION  Patient Location: PACU  Anesthesia Type:Spinal  Level of Consciousness: awake  Airway & Oxygen Therapy: Patient Spontanous Breathing  Post-op Assessment: Report given to RN  Post vital signs: Reviewed and stable  Last Vitals:  Vitals Value Taken Time  BP 100/65 11/18/21 0853  Temp    Pulse 72 11/18/21 0858  Resp 19 11/18/21 0858  SpO2 100 % 11/18/21 0858  Vitals shown include unvalidated device data.  Last Pain:  Vitals:   11/18/21 0600  TempSrc: Oral         Complications: No notable events documented.

## 2021-11-18 NOTE — Progress Notes (Signed)
MOB was referred for history of depression/anxiety. * Referral screened out by Clinical Social Worker because none of the following criteria appear to apply: ~ History of anxiety/depression during this pregnancy, or of post-partum depression following prior delivery. ~ Diagnosis of anxiety and/or depression within last 3 years: No concerns noted in PNC records.  OR * MOB's symptoms currently being treated with medication and/or therapy.  Please contact the Clinical Social Worker if needs arise, by MOB request, or if MOB scores greater than 9/yes to question 10 on Edinburgh Postpartum Depression Screen.   Pasha Gadison Boyd-Gilyard, MSW, LCSW Clinical Social Work [phone]  

## 2021-11-18 NOTE — Anesthesia Postprocedure Evaluation (Signed)
Anesthesia Post Note  Patient: Krystal Stephenson  Procedure(s) Performed: CESAREAN SECTION     Patient location during evaluation: PACU Anesthesia Type: Spinal Level of consciousness: oriented and awake and alert Pain management: pain level controlled Vital Signs Assessment: post-procedure vital signs reviewed and stable Respiratory status: spontaneous breathing, respiratory function stable and nonlabored ventilation Cardiovascular status: blood pressure returned to baseline and stable Postop Assessment: no headache, no backache, no apparent nausea or vomiting and spinal receding Anesthetic complications: no   No notable events documented.  Last Vitals:  Vitals:   11/18/21 0945 11/18/21 0959  BP: 98/74 91/70  Pulse: 74 76  Resp: (!) 21   Temp: 36.6 C 36.8 C  SpO2: 100% 99%    Last Pain:  Vitals:   11/18/21 0959  TempSrc:   PainSc: 0-No pain   Pain Goal:    LLE Motor Response: Purposeful movement (11/18/21 0945)   RLE Motor Response: Purposeful movement (11/18/21 0945)       Epidural/Spinal Function Cutaneous sensation: Able to Discern Pressure (11/18/21 0945), Patient able to flex knees: Yes (11/18/21 0945), Patient able to lift hips off bed: No (11/18/21 0945), Back pain beyond tenderness at insertion site: No (11/18/21 0945), Progressively worsening motor and/or sensory loss: No (11/18/21 0945), Bowel and/or bladder incontinence post epidural: No (11/18/21 0945)  Lucretia Kern

## 2021-11-18 NOTE — Interval H&P Note (Signed)
History and Physical Interval Note:  11/18/2021 7:12 AM  Lamount Cranker  has presented today for surgery, with the diagnosis of repeat c-section.  The various methods of treatment have been discussed with the patient and family. After consideration of risks, benefits and other options for treatment, the patient has consented to  Procedure(s): CESAREAN SECTION (N/A) as a surgical intervention.  The patient's history has been reviewed, patient examined, no change in status, stable for surgery.  I have reviewed the patient's chart and labs.  Questions were answered to the patient's satisfaction.     Leighton Roach Andrina Locken

## 2021-11-19 LAB — CBC
HCT: 31.2 % — ABNORMAL LOW (ref 36.0–46.0)
Hemoglobin: 10.2 g/dL — ABNORMAL LOW (ref 12.0–15.0)
MCH: 29.1 pg (ref 26.0–34.0)
MCHC: 32.7 g/dL (ref 30.0–36.0)
MCV: 88.9 fL (ref 80.0–100.0)
Platelets: 235 10*3/uL (ref 150–400)
RBC: 3.51 MIL/uL — ABNORMAL LOW (ref 3.87–5.11)
RDW: 14.4 % (ref 11.5–15.5)
WBC: 17.7 10*3/uL — ABNORMAL HIGH (ref 4.0–10.5)
nRBC: 0 % (ref 0.0–0.2)

## 2021-11-19 NOTE — Lactation Note (Signed)
This note was copied from a baby's chart. Lactation Consultation Note  Patient Name: Girl Maudie Shingledecker [id number] Date: 11/19/2021 Reason for consult: Follow-up assessment;Mother's request;Difficult latch;Term;Breastfeeding assistance;RN request Age:37 hours LC assisted with latching infant on left breast. We worked on different positions including football and cross cradle using tea cup hold. Infant preference cradle as Mom latching this way on right side. Infant latched for about 6 min with signs of milk transfer in cradle. Mother take care latch does not become shallow with cradle latch using 24 NS.   Mom to try latching on right infant calmer, then try latch with either tea cup hold or use of 24 NS with breast compression in cradle, infant preference.   RN, Nila Nephew to provide coconut oil for nipple care.  All questions answered at the end of the visit.    Maternal Data Has patient been taught Hand Expression?: Yes  Feeding Mother's Current Feeding Choice: Breast Milk  LATCH Score Latch: Repeated attempts needed to sustain latch, nipple held in mouth throughout feeding, stimulation needed to elicit sucking reflex.  Audible Swallowing: A few with stimulation  Type of Nipple: Flat  Comfort (Breast/Nipple): Soft / non-tender  Hold (Positioning): Assistance needed to correctly position infant at breast and maintain latch.  LATCH Score: 6   Lactation Tools Discussed/Used    Interventions Interventions: Breast feeding basics reviewed;Assisted with latch;Skin to skin;Breast massage;Hand express;Breast compression;Adjust position;Position options;Expressed milk;DEBP;Education;Coconut oil;Visual merchandiser education  Discharge    Consult Status Consult Status: Follow-up Date: 11/20/21 Follow-up type: In-patient    Lylee Corrow  Nicholson-Springer 11/19/2021, 5:32 PM

## 2021-11-19 NOTE — Lactation Note (Addendum)
This note was copied from a baby's chart. Lactation Consultation Note  Patient Name: Krystal Stephenson [id number] Date: 11/19/2021 Reason for consult: Initial assessment;Mother's request;Term Age:37 hours  Mom has primarily been feeding from her R breast. Mom commented that this infant is doing better than any of her other children in regards to nursing from her R breast (she needed a nipple shield with her previous children). Mom is using a nipple shield on her L breast. Nipple eversion on the L side is noted with stimulation.  Infant fed recently from R breast and was sleeping during consult. I suggested that Mom pump the L breast when infant has fed from the R breast only. Mom will call for me when ready to return.   Mom was made aware of O/P services, breastfeeding support groups, and our phone # for post-discharge questions.    Matthias Hughs [facility] 11/19/2021, 11:14 AM

## 2021-11-19 NOTE — Progress Notes (Signed)
Subjective: Postpartum Day 1: Cesarean Delivery Patient reports incisional pain, tolerating PO, and no problems voiding.  Ambulating well  Objective: Vital signs in last 24 hours: Temp:  [97.9 F (36.6 C)-98.4 F (36.9 C)] 98.2 F (36.8 C) (01/21 0300) Pulse Rate:  [67-78] 72 (01/21 0300) Resp:  [16-18] 16 (01/21 0300) BP: (92-99)/(60-70) 92/65 (01/21 0300) SpO2:  [98 %-100 %] 98 % (01/21 0300)  Physical Exam:  General: alert and cooperative Lochia: appropriate Uterine Fundus: firm Incision: C/D/I   Recent Labs    11/19/21 0509  HGB 10.2*  HCT 31.2*    Assessment/Plan: Status post Cesarean section. Doing well postoperatively.  Continue current care. Plan for d/c tomorrow 11/20/21 [name] 11/19/2021, 10:20 AM

## 2021-11-19 NOTE — Plan of Care (Signed)
Pt stable throughout shift, monitored VS and fundus q4 hours x 3. All assessments stable. IV fluids d/c'd and foley removed. Pt urinated x 2 after foley removed. Pain well-controlled with tylenol. Pt refuses toradol. Continue to monitor and support.

## 2021-11-20 NOTE — Progress Notes (Signed)
Subjective: Postpartum Day 2: Cesarean Delivery Patient reports tolerating PO, + flatus, and no problems voiding.   Pt states baby still working on feedings and prefers to stay another day to work with lactation  Objective: Vital signs in last 24 hours: Temp:  [97.7 F (36.5 C)-98.5 F (36.9 C)] 97.7 F (36.5 C) (01/22 0655) Pulse Rate:  [69-80] 77 (01/22 0655) Resp:  [14-16] 14 (01/22 0655) BP: (96-116)/(58-81) 106/81 (01/22 0655) SpO2:  [99 %-100 %] 100 % (01/22 0655)  Physical Exam:  General: alert and cooperative Lochia: appropriate Uterine Fundus: firm Incision: C/D/I DVT Evaluation: No evidence of DVT seen on physical exam.  Recent Labs    11/19/21 0509  HGB 10.2*  HCT 31.2*    Assessment/Plan: Status post Cesarean section. Doing well postoperatively.  Continue current care, lactation support Doing well with ibuprofen, tylenol and occasional oxycodone Krystal Stephenson 11/20/2021, 9:23 AM

## 2021-11-20 NOTE — Lactation Note (Signed)
This note was copied from a baby's chart. Lactation Consultation Note  Patient Name: Girl Wreatha Sturgeon, 36 years old [id number] Date: 11/20/2021 Reason for consult: Follow-up assessment Age:36 years P3, term female infant -8% weight loss. Infant was asleep and recently breastfeed at 1730 pm. Per mom, infant is breastfeeding well on her right side most feeding recently are 25 to 30 minutes, infant is latching without NS. Mom has been pumping left side and not latching infant at breast, she recently pumped 15 mls. Mom has latched infant on her left breast since 1940 am yesterday, only been pumping her left breast. Mom plans to start pumping her  right side as well her breast are starting to feel full not time for infant to feeding again. LC discussed with mom, if she would like to try latching infant on her left side to call for LC at next feeding. Mom was using 24 mm NS on left side only due to nipple  not everting out completely. Mom's plan: 1-LC suggested mom try latching infant on left side again, due infant being older and has learned how to latch, try 24 mm NS again , latch infant on left side for next feeding. 2-Try latch infant on both breast during a feeding. 3-Mom plans to continue to supplement infant with her EBM  after breastfeeding infant, until infant regains weight she loss. 4-Mom will increase supplementation to 18-25 mls per feeding.  Maternal Data    Feeding Mother's Current Feeding Choice: Breast Milk Nipple Type: Slow - flow  LATCH Score                    Lactation Tools Discussed/Used    Interventions    Discharge    Consult Status Consult Status: Follow-up Date: 11/21/21 Follow-up type: In-patient    Danelle Earthly 11/20/2021, 6:18 PM

## 2021-11-21 MED ORDER — IBUPROFEN 800 MG PO TABS: 800.0000 mg | ORAL_TABLET | Freq: Three times a day (TID) | ORAL | 1 refills | Status: AC | PRN

## 2021-11-21 MED ORDER — OXYCODONE HCL 5 MG PO TABS: 5.0000 mg | ORAL_TABLET | Freq: Four times a day (QID) | ORAL | 0 refills | Status: AC | PRN

## 2021-11-21 NOTE — Lactation Note (Signed)
This note was copied from a baby's chart. Lactation Consultation Note  Patient Name: Krystal Stephenson [id number] Date: 11/21/2021 Reason for consult: Follow-up assessment Age:37 hours  P3, Ex BF.  Mother is pumping 60 ml. She is latching baby on R, still having difficulty on L but mother prepumps and states she feels confident is will resolve.  Reviewed engorgement care and monitoring voids/stools. Denies questions or concerns.    Feeding Mother's Current Feeding Choice: Breast Milk   Lactation Tools Discussed/Used Tools: Pump Nipple shield size: 24 Breast pump type: Double-Electric Breast Pump (Personal Medela) Reason for Pumping: supplementation Pumped volume: 60 mL  Interventions Interventions: Education;DEBP  Discharge Discharge Education: Engorgement and breast care;Warning signs for feeding baby  Consult Status Consult Status: Complete Date: 11/21/21    Vivianne Master [facility] 11/21/2021, 10:40 AM

## 2021-11-21 NOTE — Progress Notes (Signed)
Subjective: Postpartum Day 3: Cesarean Delivery Patient reports tolerating PO, + flatus, and no problems voiding.   Pt worked further with lactation yesterday, aware of outpatient availability as well. Asx from COVID standpoint  Objective: Vital signs in last 24 hours: Temp:  [98 F (36.7 C)-98.5 F (36.9 C)] 98 F (36.7 C) (01/23 0525) Pulse Rate:  [77-81] 77 (01/23 0525) Resp:  [16] 16 (01/23 0525) BP: (101)/(72) 101/72 (01/22 2300) SpO2:  [99 %-100 %] 100 % (01/23 0525)  Physical Exam:  General: alert and cooperative Lochia: appropriate Uterine Fundus: firm Incision: C/D/I DVT Evaluation: No evidence of DVT seen on physical exam.  Recent Labs    11/19/21 0509  HGB 10.2*  HCT 31.2*     Assessment/Plan: Status post Cesarean section. Doing well postoperatively.  Continue current care Doing well with ibuprofen, tylenol and occasional oxycodone DC home with incision check in 2wks, postpartum in 6wks Dorion Petillo M Eliazer Hemphill 11/21/2021, 7:39 AM

## 2021-11-21 NOTE — Discharge Summary (Signed)
Postpartum Discharge Summary  Date of Service updated     Patient Name: Krystal Stephenson DOB: [birth year] MRN: [id number]  Date of admission: 11/18/2021 Delivery date:11/18/2021  Delivering provider: Jackelyn Knife, TODD  Date of discharge: 11/21/2021  Admitting diagnosis: Maternal care due to low transverse uterine scar from previous cesarean delivery [O34.211] S/P cesarean section [Z98.891] Intrauterine pregnancy: [redacted]w[redacted]d     Secondary diagnosis:  Principal Problem:   Maternal care due to low transverse uterine scar from previous cesarean delivery Active Problems:   S/P cesarean section  Additional problems: COVID pos    Discharge diagnosis: Term Pregnancy Delivered                                              Post partum procedures: none Augmentation: N/A Complications: None  Hospital course: Sceduled C/S   36 y.o. yo G3P3003 at [redacted]w[redacted]d was admitted to the hospital 11/18/2021 for scheduled cesarean section with the following indication:Elective Repeat.Delivery details are as follows:  Membrane Rupture Time/Date: 8:03 AM ,11/18/2021   Delivery Method:C-Section, Low Transverse  Details of operation can be found in separate operative note.  Patient had an uncomplicated postpartum course.  She is ambulating, tolerating a regular diet, passing flatus, and urinating well. Patient is discharged home in stable condition on  11/21/21        Newborn Data: Birth date:11/18/2021  Birth time:8:04 AM  Gender:Female  Living status:Living  Apgars:9 ,9  Weight:3310 g       Physical exam  Vitals:   11/19/21 1950 11/20/21 0655 11/20/21 2300 11/21/21 0525  BP: 116/75 106/81 101/72   Pulse: 80 77 81 77  Resp: 16 14 16 16   Temp: 98.5 F (36.9 C) 97.7 F (36.5 C) 98.5 F (36.9 C) 98 F (36.7 C)  TempSrc: Oral Oral Oral Oral  SpO2: 99% 100% 99% 100%  Weight:      Height:       General: alert, cooperative, and no distress Lochia: appropriate Uterine Fundus: firm Incision: Healing well with  no significant drainage, No significant erythema, Dressing is clean, dry, and intact DVT Evaluation: No evidence of DVT seen on physical exam. Negative Homan's sign. No cords or calf tenderness. Labs: Lab Results  Component Value Date   WBC 17.7 (H) 11/19/2021   HGB 10.2 (L) 11/19/2021   HCT 31.2 (L) 11/19/2021   MCV 88.9 11/19/2021   PLT 235 11/19/2021   CMP Latest Ref Rng & Units 05/19/2016  Glucose 65 - 99 mg/dL 05/21/2016)  BUN 6 - 20 mg/dL 8  Creatinine 470(J - 6.28 mg/dL 3.66  Sodium 2.94 - 765 mmol/L 139  Potassium 3.5 - 5.1 mmol/L 3.2(L)  Chloride 101 - 111 mmol/L 105  CO2 22 - 32 mmol/L 27  Calcium 8.9 - 10.3 mg/dL 9.5   Edinburgh Score: Edinburgh Postnatal Depression Scale Screening Tool 11/18/2021  I have been able to laugh and see the funny side of things. 0  I have looked forward with enjoyment to things. 0  I have blamed myself unnecessarily when things went wrong. 0  I have been anxious or worried for no good reason. 0  I have felt scared or panicky for no good reason. 1  Things have been getting on top of me. 1  I have been so unhappy that I have had difficulty sleeping. 0  I have felt sad or miserable.  1  I have been so unhappy that I have been crying. 0  The thought of harming myself has occurred to me. 0  Edinburgh Postnatal Depression Scale Total 3      After visit meds:  Allergies as of 11/21/2021   No Known Allergies      Medication List     STOP taking these medications    acetaminophen 325 MG tablet Commonly known as: TYLENOL       TAKE these medications    calcium carbonate 500 MG chewable tablet Commonly known as: TUMS - dosed in mg elemental calcium Chew 1-2 tablets by mouth 3 (three) times daily as needed for indigestion or heartburn.   ibuprofen 800 MG tablet Commonly known as: ADVIL Take 1 tablet (800 mg total) by mouth every 8 (eight) hours as needed.   oxyCODONE 5 MG immediate release tablet Commonly known as: Oxy  IR/ROXICODONE Take 1 tablet (5 mg total) by mouth every 6 (six) hours as needed for severe pain.   prenatal multivitamin Tabs tablet Take 1 tablet by mouth daily.               Discharge Care Instructions  (From admission, onward)           Start     Ordered   11/21/21 0000  May remove dressing at time indicated with marker. Call with any concerns     11/21/21 0742             Discharge home in stable condition Infant Feeding: Bottle and Breast Infant Disposition:home with mother Discharge instruction: per After Visit Summary and Postpartum booklet. Activity: Advance as tolerated. Pelvic rest for 6 weeks.  Diet: routine diet Anticipated Birth Control: Unsure Postpartum Appointment:6 weeks Additional Postpartum F/U: Incision check 2wk Future Appointments:No future appointments. Follow up Visit:  Follow-up Information     Meisinger, Todd, MD. Schedule an appointment as soon as possible for a visit in 2 week(s).   Specialty: Obstetrics and Gynecology Why: Incision check Contact information: [address], [address] [place] [address] [phone]                     11/21/2021 Carlisle Cater, MD

## 2021-12-03 ENCOUNTER — Telehealth (HOSPITAL_COMMUNITY): Payer: Self-pay

## 2021-12-03 NOTE — Telephone Encounter (Signed)
No answer. Left message to return nurse call.  Marcelino Duster [facility] 12/03/2021,1117
